# Patient Record
Sex: Male | Born: 1935 | Race: White | Hispanic: No | Marital: Married | Smoking: Current every day smoker
Health system: Southern US, Community
[De-identification: ages and names within clinical notes are randomized; demographics above are authoritative.]

## PROBLEM LIST (undated history)

## (undated) DIAGNOSIS — E785 Hyperlipidemia, unspecified: Secondary | ICD-10-CM

## (undated) DIAGNOSIS — I639 Cerebral infarction, unspecified: Secondary | ICD-10-CM

## (undated) DIAGNOSIS — I219 Acute myocardial infarction, unspecified: Secondary | ICD-10-CM

## (undated) DIAGNOSIS — I10 Essential (primary) hypertension: Secondary | ICD-10-CM

## (undated) DIAGNOSIS — N183 Chronic kidney disease, stage 3 unspecified: Secondary | ICD-10-CM

## (undated) DIAGNOSIS — I509 Heart failure, unspecified: Secondary | ICD-10-CM

## (undated) HISTORY — PX: CORONARY ANGIOPLASTY WITH STENT PLACEMENT: SHX49

## (undated) HISTORY — PX: CAROTID ENDARTERECTOMY: SUR193

---

## 2003-10-30 ENCOUNTER — Other Ambulatory Visit: Payer: Self-pay

## 2004-10-19 ENCOUNTER — Ambulatory Visit: Payer: Self-pay | Admitting: Internal Medicine

## 2011-09-07 ENCOUNTER — Other Ambulatory Visit: Payer: Self-pay | Admitting: Nephrology

## 2011-11-13 ENCOUNTER — Emergency Department: Payer: Self-pay | Admitting: Internal Medicine

## 2012-07-11 ENCOUNTER — Inpatient Hospital Stay: Payer: Self-pay | Admitting: Family Medicine

## 2012-07-11 LAB — COMPREHENSIVE METABOLIC PANEL
Albumin: 3.6 g/dL (ref 3.4–5.0)
Alkaline Phosphatase: 89 U/L (ref 50–136)
Anion Gap: 10 (ref 7–16)
BUN: 32 mg/dL — ABNORMAL HIGH (ref 7–18)
Creatinine: 2.01 mg/dL — ABNORMAL HIGH (ref 0.60–1.30)
EGFR (Non-African Amer.): 31 — ABNORMAL LOW
Glucose: 151 mg/dL — ABNORMAL HIGH (ref 65–99)
Osmolality: 285 (ref 275–301)
Potassium: 4.7 mmol/L (ref 3.5–5.1)
SGOT(AST): 15 U/L (ref 15–37)
Sodium: 138 mmol/L (ref 136–145)
Total Protein: 7.8 g/dL (ref 6.4–8.2)

## 2012-07-11 LAB — TROPONIN I: Troponin-I: <0.02 ng/mL

## 2012-07-11 LAB — CBC
HCT: 41.6 % (ref 40.0–52.0)
HGB: 13.5 g/dL (ref 13.0–18.0)
MCH: 30.9 pg (ref 26.0–34.0)
MCHC: 32.6 g/dL (ref 32.0–36.0)
RDW: 14.4 % (ref 11.5–14.5)

## 2012-07-11 LAB — URINALYSIS, COMPLETE
Bacteria: NONE SEEN
Bilirubin,UR: NEGATIVE
Glucose,UR: NEGATIVE mg/dL (ref 0–75)
Hyaline Cast: 3
Leukocyte Esterase: NEGATIVE
Nitrite: NEGATIVE
Specific Gravity: 1.014 (ref 1.003–1.030)
Squamous Epithelial: <1
WBC UR: 1 /HPF (ref 0–5)

## 2012-07-11 LAB — CK TOTAL AND CKMB (NOT AT ARMC): CK-MB: 0.9 ng/mL (ref 0.5–3.6)

## 2012-07-11 LAB — TSH: Thyroid Stimulating Horm: 1.47 u[IU]/mL

## 2012-07-12 LAB — BASIC METABOLIC PANEL
BUN: 33 mg/dL — ABNORMAL HIGH (ref 7–18)
Calcium, Total: 8.1 mg/dL — ABNORMAL LOW (ref 8.5–10.1)
Chloride: 108 mmol/L — ABNORMAL HIGH (ref 98–107)
Co2: 23 mmol/L (ref 21–32)
EGFR (Non-African Amer.): 30 — ABNORMAL LOW
Glucose: 138 mg/dL — ABNORMAL HIGH (ref 65–99)
Osmolality: 287 (ref 275–301)
Potassium: 4 mmol/L (ref 3.5–5.1)
Sodium: 139 mmol/L (ref 136–145)

## 2012-07-12 LAB — LIPID PANEL
Cholesterol: 98 mg/dL (ref 0–200)
Ldl Cholesterol, Calc: 39 mg/dL (ref 0–100)

## 2012-07-12 LAB — CBC WITH DIFFERENTIAL/PLATELET
Basophil #: 0.1 10*3/uL (ref 0.0–0.1)
Eosinophil #: 0.2 10*3/uL (ref 0.0–0.7)
HGB: 12.7 g/dL — ABNORMAL LOW (ref 13.0–18.0)
Lymphocyte #: 2.4 10*3/uL (ref 1.0–3.6)
Lymphocyte %: 18.1 %
MCH: 31.2 pg (ref 26.0–34.0)
MCHC: 33.1 g/dL (ref 32.0–36.0)
Monocyte #: 0.6 x10 3/mm (ref 0.2–1.0)
Neutrophil #: 9.9 10*3/uL — ABNORMAL HIGH (ref 1.4–6.5)
RBC: 4.05 10*6/uL — ABNORMAL LOW (ref 4.40–5.90)
RDW: 14.4 % (ref 11.5–14.5)

## 2012-07-12 LAB — CK TOTAL AND CKMB (NOT AT ARMC)
CK, Total: 76 U/L (ref 35–232)
CK, Total: 203 U/L (ref 35–232)

## 2012-07-12 IMAGING — US US CAROTID DUPLEX BILAT
1 series · 14 of 24 positions shown · non-contrast
Comparison: none

REASON FOR EXAM: syncope
COMMENTS:

PROCEDURE:     US  - US CAROTID DOPPLER BILATERAL  - [DATE]  [DATE]
RESULT:
TECHNIQUE: Grayscale, Duplex Doppler, color flow, and SPECTRAL waveform
imaging was performed of the right and left carotid systems.

[Series 1: us carotid duplex bilat · 14 of 54 slices shown]
[im 1/54]
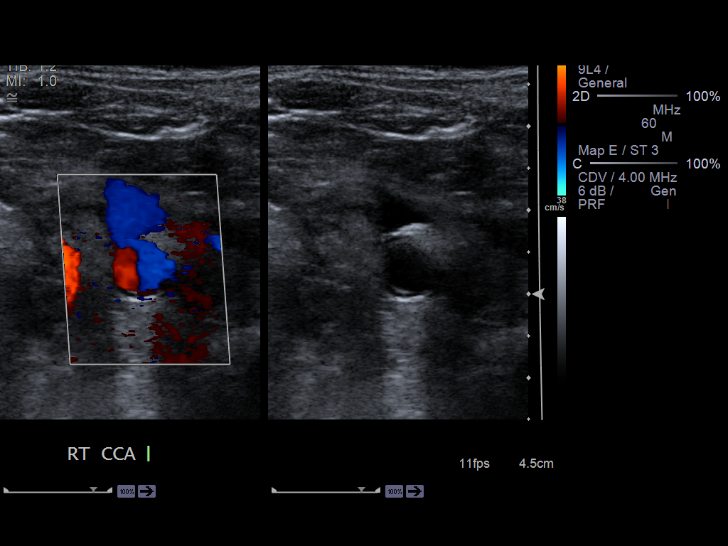
[im 5/54]
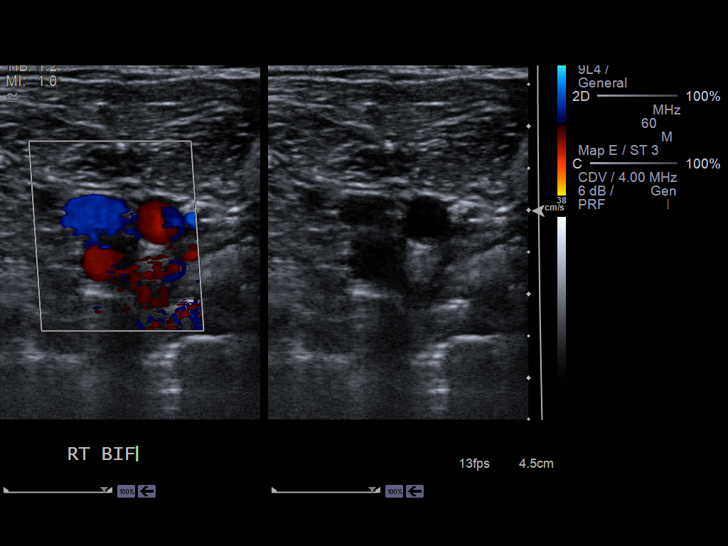
[im 10/54]
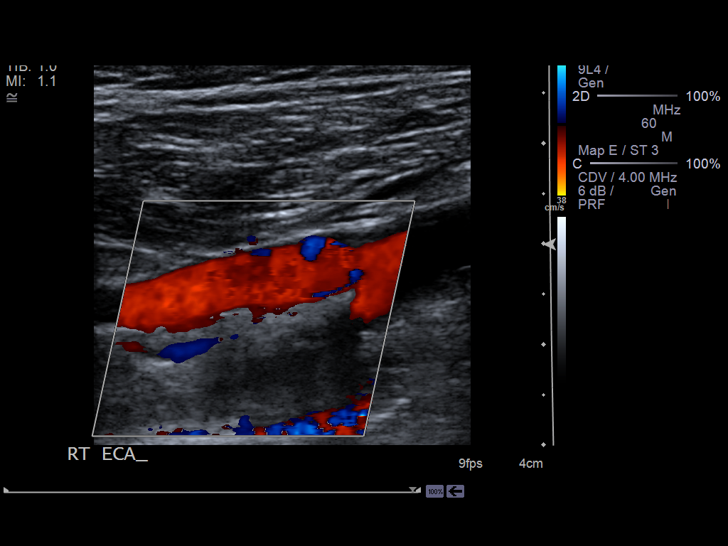
[im 14/54]
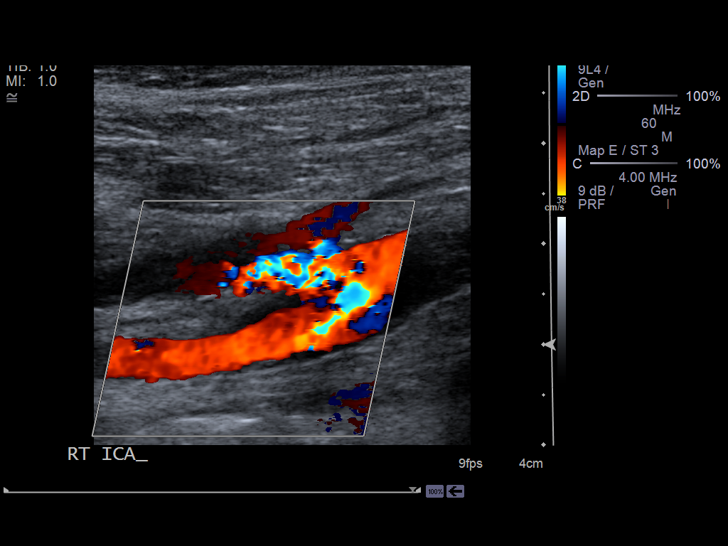
[im 17/54]
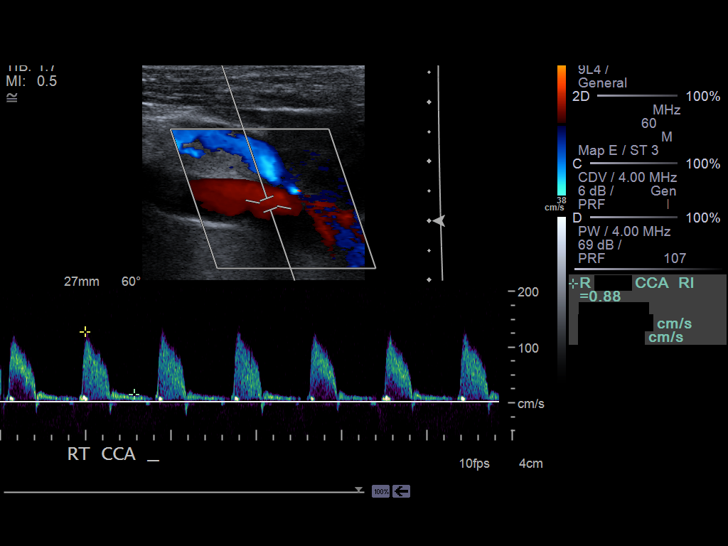
[im 21/54]
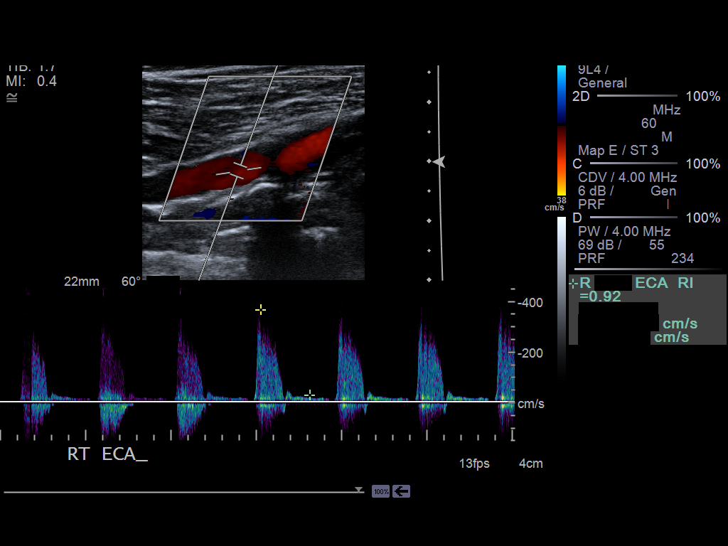
[im 26/54]
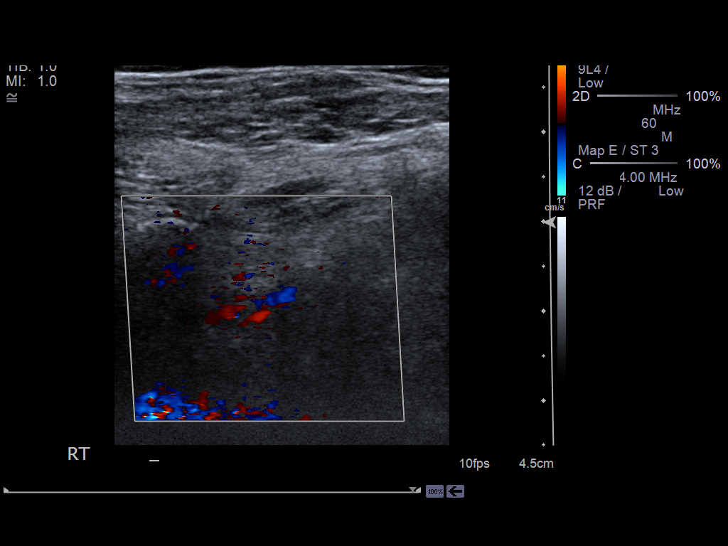
[im 28/54]
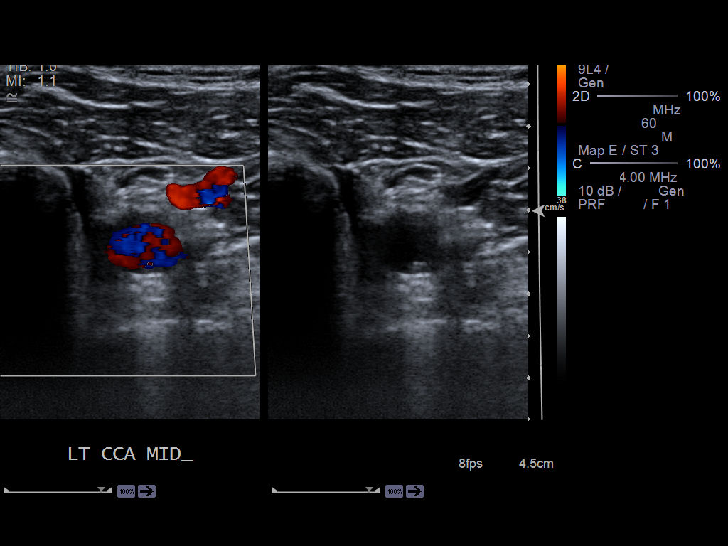
[im 33/54]
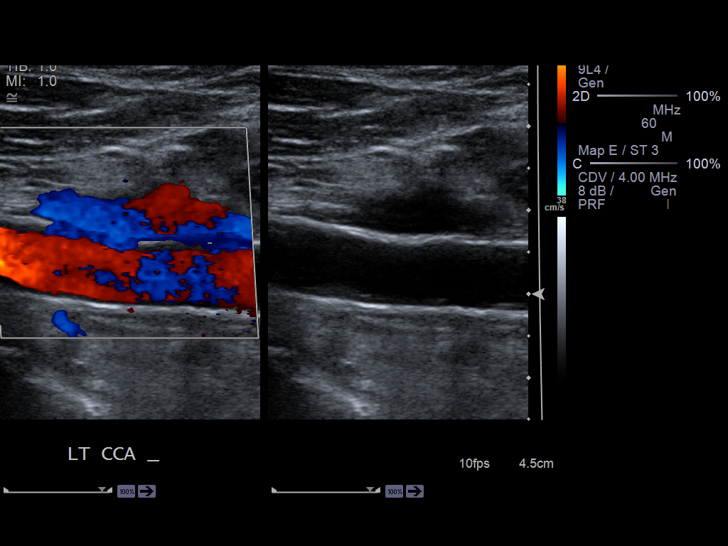
[im 37/54]
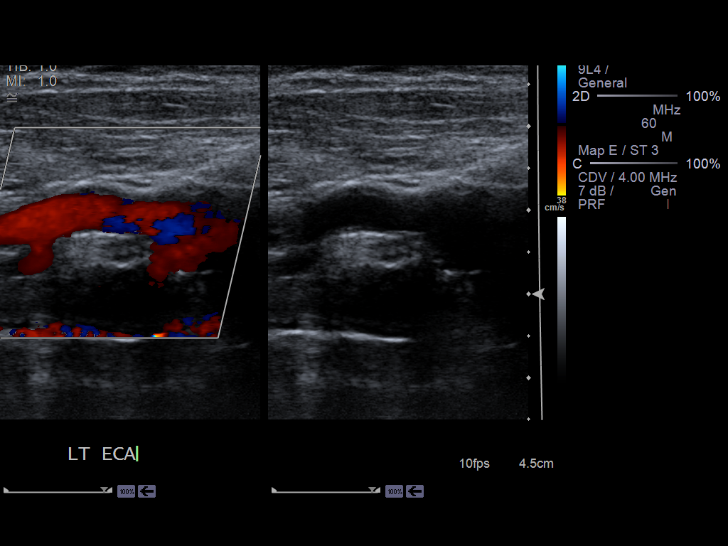
[im 42/54]
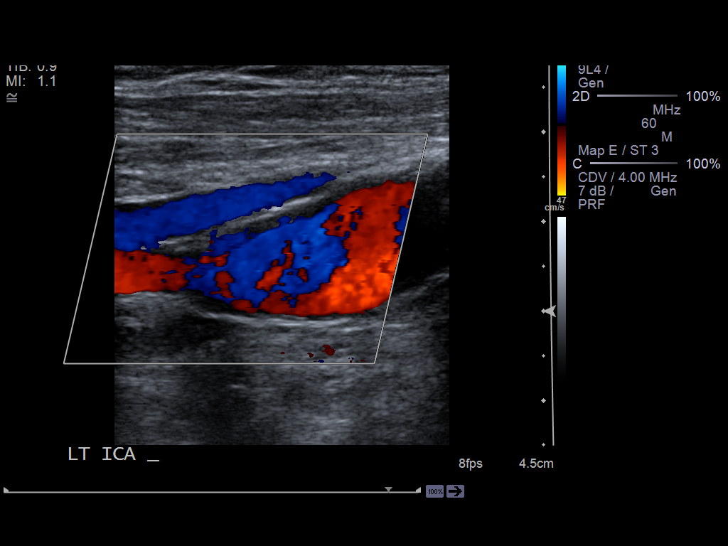
[im 44/54]
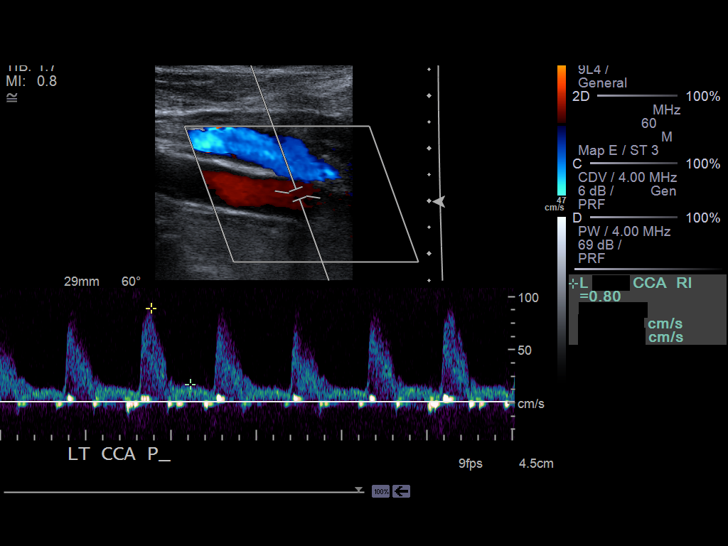
[im 49/54]
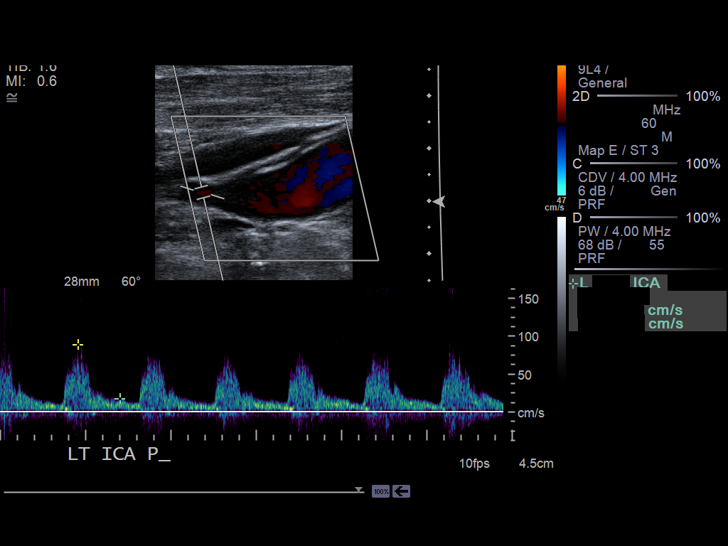
[im 54/54]
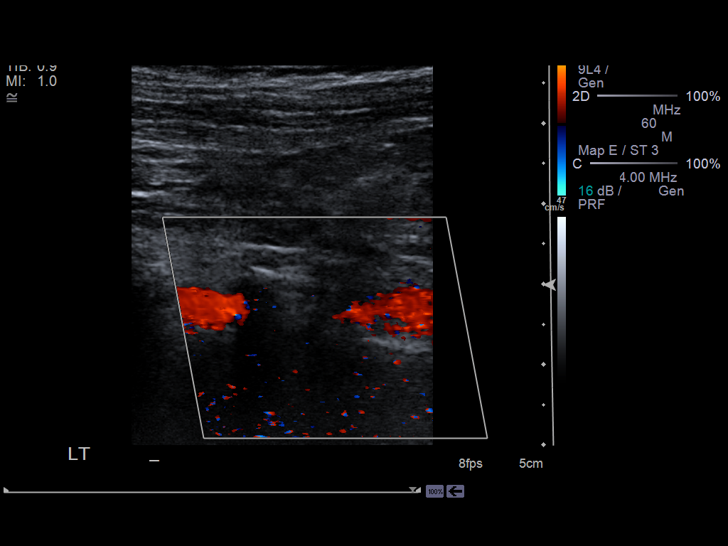

[14 of 24 positions shown; findings below may reference images not displayed]

FINDINGS: Evaluation of the right carotid system demonstrates primarily soft
plaque within the common carotid, carotid bulb and internal carotid artery
demonstrating less than 50% stenosis. Similar findings are identified on the
left. Appropriate color filling is identified within the right and left
carotid systems as well as appropriate SPECTRAL waveform imaging. Antegrade
flow is identified within the right and left vertebral arteries.

ICA/CCA ratios:

Right:
Left:
IMPRESSION: 1. No sonographic evidence of hemodynamically significant stenosis within
the right or left carotid systems.

## 2012-07-13 LAB — BASIC METABOLIC PANEL
Anion Gap: 9 (ref 7–16)
BUN: 24 mg/dL — ABNORMAL HIGH (ref 7–18)
Chloride: 108 mmol/L — ABNORMAL HIGH (ref 98–107)
Co2: 23 mmol/L (ref 21–32)
EGFR (African American): 39 — ABNORMAL LOW
EGFR (Non-African Amer.): 34 — ABNORMAL LOW
Osmolality: 283 (ref 275–301)

## 2012-10-26 ENCOUNTER — Ambulatory Visit: Payer: Self-pay | Admitting: Vascular Surgery

## 2012-10-26 LAB — BASIC METABOLIC PANEL
Anion Gap: 9 (ref 7–16)
BUN: 28 mg/dL — ABNORMAL HIGH (ref 7–18)
Calcium, Total: 8.8 mg/dL (ref 8.5–10.1)
Chloride: 110 mmol/L — ABNORMAL HIGH (ref 98–107)
Co2: 20 mmol/L — ABNORMAL LOW (ref 21–32)
Creatinine: 2.03 mg/dL — ABNORMAL HIGH (ref 0.60–1.30)
EGFR (African American): 36 — ABNORMAL LOW
EGFR (Non-African Amer.): 31 — ABNORMAL LOW
Glucose: 109 mg/dL — ABNORMAL HIGH (ref 65–99)
Osmolality: 284 (ref 275–301)
Potassium: 5 mmol/L (ref 3.5–5.1)
Sodium: 139 mmol/L (ref 136–145)

## 2014-08-12 LAB — COMPREHENSIVE METABOLIC PANEL
ALT: 20 U/L
AST: 16 U/L (ref 15–37)
Albumin: 3.1 g/dL — ABNORMAL LOW (ref 3.4–5.0)
Alkaline Phosphatase: 83 U/L
Anion Gap: 10 (ref 7–16)
BUN: 19 mg/dL — AB (ref 7–18)
Bilirubin,Total: 0.2 mg/dL (ref 0.2–1.0)
Calcium, Total: 8.3 mg/dL — ABNORMAL LOW (ref 8.5–10.1)
Chloride: 105 mmol/L (ref 98–107)
Co2: 23 mmol/L (ref 21–32)
Creatinine: 1.85 mg/dL — ABNORMAL HIGH (ref 0.60–1.30)
EGFR (African American): 40 — ABNORMAL LOW
GFR CALC NON AF AMER: 34 — AB
GLUCOSE: 143 mg/dL — AB (ref 65–99)
Osmolality: 280 (ref 275–301)
POTASSIUM: 3.9 mmol/L (ref 3.5–5.1)
SODIUM: 138 mmol/L (ref 136–145)
Total Protein: 7.1 g/dL (ref 6.4–8.2)

## 2014-08-12 LAB — URINALYSIS, COMPLETE
Bilirubin,UR: NEGATIVE
Glucose,UR: NEGATIVE mg/dL (ref 0–75)
Ketone: NEGATIVE
LEUKOCYTE ESTERASE: NEGATIVE
NITRITE: NEGATIVE
PROTEIN: NEGATIVE
Ph: 5 (ref 4.5–8.0)
SPECIFIC GRAVITY: 1.013 (ref 1.003–1.030)
Squamous Epithelial: NONE SEEN
WBC UR: <1 /HPF (ref 0–5)

## 2014-08-12 LAB — CBC
HCT: 38.8 % — ABNORMAL LOW (ref 40.0–52.0)
HGB: 12.5 g/dL — ABNORMAL LOW (ref 13.0–18.0)
MCH: 31.1 pg (ref 26.0–34.0)
MCHC: 32.2 g/dL (ref 32.0–36.0)
MCV: 97 fL (ref 80–100)
Platelet: 258 10*3/uL (ref 150–440)
RBC: 4.02 10*6/uL — ABNORMAL LOW (ref 4.40–5.90)
RDW: 13.8 % (ref 11.5–14.5)
WBC: 12.6 10*3/uL — ABNORMAL HIGH (ref 3.8–10.6)

## 2014-08-12 LAB — TROPONIN I: Troponin-I: <0.02 ng/mL

## 2014-08-13 ENCOUNTER — Inpatient Hospital Stay: Payer: Self-pay | Admitting: Family Medicine

## 2015-03-25 NOTE — Discharge Summary (Signed)
PATIENT NAME:  Lonnie [name], Lonnie [name] MR#:  [id number] DATE OF BIRTH:  1934-03-29  DATE OF ADMISSION:  07/11/2012 DATE OF DISCHARGE:  07/13/2012  DISCHARGE DIAGNOSES:  1. Transient ischemic attack.  2. History of cerebrovascular accident.  3. Hypertension.  4. History of coronary artery disease status post myocardial infarction.  5. Chronic renal failure.  6. Hyperlipidemia.  7. New seizure.   DISCHARGE MEDICATIONS:  1. Metoprolol 50 mg p.o. b.i.[name].  2. Lisinopril 20 mg p.o. daily.  3. Lovastatin 40 mg p.o. daily.  4. Folic acid 1 mg p.o. daily.  5. Hydrochlorothiazide 25 mg p.o. daily.  6. Plavix 75 mg p.o. daily.   CONSULT: Neurology per Dr. Kemper Durielarke    PROCEDURES: None.   PERTINENT LABS: The patient had a CT of the head that showed no acute changes.   MRI of the brain showed chronic ischemic changes. No acute changes. Carotids were negative for stenosis.   Cardiac enzymes were negative. TSH 1.47. LDL 39. Total cholesterol 98. Triglycerides 142. HDL 31. Sodium 140. Potassium 4.4. Creatinine 1.88.   BRIEF HOSPITAL COURSE:  1. Transient ischemic attack with history of cerebrovascular accident. The patient initially came in with complaints of focal weakness with a history of CVA on aspirin. His symptoms have improved and resolved prior to discharge. He was seen by Neurology, Dr. Kemper Durielarke, who did not recommend any changes to his regimen. There was some concern of whether or not he had a seizure during this episode. Per Dr. Kemper Durielarke, will hold off on antiepileptic meds for now. The patient was evaluated by PT, OT, and speech therapy and all cleared him to go home without any further therapy.  2. Disposition. He is in stable condition being discharged to home with instructions to follow his blood pressure closely and follow-up with Dr. Burnadette PopLinthavong within two weeks.   ____________________________ Lonnie IvanKanhka Jessamy Torosyan, MD kl:drc [name]: 07/13/2012 08:01:05 ET T: 07/13/2012 10:31:19  ET JOB#: [id number]  cc: Lonnie IvanKanhka Ashby Moskal, MD, <Dictator> Lonnie IvanKANHKA Yann Biehn MD ELECTRONICALLY SIGNED 08/03/2012 11:00

## 2015-03-25 NOTE — H&P (Signed)
PATIENT NAME:  [name], Anil D MR#:  [id number] DATE OF BIRTH:  1934/11/16  DATE OF ADMISSION:  07/11/2012  PRIMARY CARE PHYSICIAN: Dr. Marisue IvanKanhka Linthavong   CHIEF COMPLAINT: Loss of consciousness.   HISTORY OF PRESENT ILLNESS: 81 year old male with history of prior cerebrovascular accident with residual left side weakness per report, history of coronary artery disease with prior myocardial infarction, history of peripheral vascular disease with prior stenting in the right leg. He reports that over the last several days he has been a little less attentive, as noted by his wife, the patient had noted much of this on his own. This afternoon he had an episode where he became abruptly diaphoretic and less responsive. He did not appear to be indicating any chest pain, shortness of breath, or any other focal problems. Shortly after this he had an episode where he had complete loss of consciousness, eyes rolling back in his head. He was seated and he shifted to the right when this happened. His wife noted that his left hand and arm seem to flex up against his chest wall. He was unresponsive for several minutes before he awoke. At that time he appeared to be confused and dysarthric. She was not sure whether he was moving his left arm. She reports that she could not feel a pulse and that she was trained to look for a pulse after he had his previous myocardial infarction. After about five minutes she did feel a pulse and felt that this was very slow. Ambulance was contacted, and on arrival recommend transfer to the local hospital, the patient refused. Family members then [facility], stopping to get him some water on the way. During the trip he appeared to become more lucid, though did complain of some left leg numbness. His speech is improved, though appears to be still thick to their ear. On arrival here CT of the head without contrast reveals no acute changes, but with atrophy and small  vessel disease. Initial cardiac enzymes are negative. He is noted to have elevated white count, with BUN 32 and creatinine of 2, with creatinine close to his baseline. On evaluation he was found to have left side weakness. He is admitted now with an episode of syncope, question of seizure, but appears to be more likely myoclonic activity, possible transient ischemic attack versus cerebrovascular accident.   PAST MEDICAL HISTORY:  1. History of cerebrovascular accident with left side weakness per report.  2. Hypertension.  3. Coronary artery disease with prior myocardial infarction, status post Cypher stent.  4. Peripheral vascular disease with history of right iliac stenosis, status post stenting of the right iliac and femoral artery many years ago.  5. Chronic kidney disease, stage III with baseline creatinine of approximately 2.  6. Hyperlipidemia.  7. History of bilateral carotid endarterectomies.   ALLERGIES: Simvastatin causes questionable reaction.   MEDICATIONS:  1. Hydrochlorothiazide 25 mg p.o. daily. 2. Lopressor 50 mg p.o. b.i.d.  3. Folate 1 mg p.o. daily.  4. Enteric-coated aspirin 81 mg p.o. daily.  5. Lisinopril 20 mg p.o. daily.  6. Lovastatin 40 mg p.o. at bedtime.   SOCIAL HISTORY: Smokes approximately half pack per day and continues to smoke. No alcohol use.   FAMILY HISTORY: Father with pneumonia, mother with heart disease. Sister with heart disease.   REVIEW OF SYSTEMS: Please see history of present illness. No recent fevers, chills, headache, neck stiffness. No current chest pain or shortness of breath. Generalized weakness described  by patient. No bowel or bladder changes. No edema. Remainder of complete review of systems is negative.   PHYSICAL EXAMINATION:  VITAL SIGNS: Temperature 97.2, pulse 62, blood pressure 161/70, weight 68.4 kg.   GENERAL: Elderly male, appears in no distress.   EYES: Pupils round and reactive to light. Lids and conjunctiva  unremarkable.   ENT: Oropharynx is slightly dry without lesions. May have left facial droop.   NECK: Postoperative changes. Trachea in the midline. No thyromegaly.   CARDIOVASCULAR: Distant heart sounds, regular, without gallops, rubs. Carotid and radial pulses are 2+ with distal pulses 1+.   LUNGS: Decreased air flow without wheeze or retractions.   ABDOMEN: Soft, nontender, nondistended. Positive bowel sounds. No guard or rebound.   SKIN: No significant rashes or nodules.   LYMPH NODES: No cervical or supraclavicular nodes.   MUSCULOSKELETAL: No clubbing or cyanosis. No significant edema.   NEUROLOGIC: Dysarthria as noted above with mild left facial droop. Answers questions appropriately. Mild left pronator drift with decreased grip in the left hand compared to right. Finger-to-nose intact. Minimal left leg weakness in comparison to right. Subjective decreased light touch in the left leg.   LABORATORY, DIAGNOSTIC, AND RADIOLOGICAL DATA: CT of the head without contrast reveals involutional changes, and chronic small vessel disease. Urinalysis is unremarkable. White count 15 with hematocrit 42 and platelets 260. Initial enzymes are negative. Glucose 151 with creatinine 2.01. Liver enzymes unremarkable. EKG with sinus rhythm without acute changes. Right bundle branch block appears present.   IMPRESSION AND PLAN:  1. Syncope/cerebrovascular accident versus transient ischemic attack. Sequence of events is unclear, but vagal episode appears likely with this diaphoresis, and may have had drop in blood pressure and heart rate with this, which certainly could have resulted in a low flow state, resulting in transient ischemic attack versus cerebrovascular accident, though unable to determine whether he might actually have had a neurovascular event initially. Will anticipate performing MRI of the brain in the morning without contrast secondary to his chronic kidney disease, provided the type of stent  he has in his leg is amenable to performing this. Family members report they have documentation on this, which was performed at [facility], will bring this in. He does have left side weakness, but without a clear idea of what his baseline is. It is difficult to determine how much this is different. Possibility of underlying seizure must be entertained, but fairly rapid recovery. Would ask neurology to see the patient for their opinion, but would hold on any antiseizure medications at this point unless there is more definitive evidence.  2. Coronary artery disease. Continue on Lopressor, reducing dose slightly secondary to some bradycardia. Cycle his enzymes. Change aspirin to Plavix, given possible neurovascular event. Side effects of this were discussed. Will continue on lisinopril, continue on lovastatin for now. Check lipids in the morning.  3. Peripheral vascular disease. He has got baseline limitation on his activity level likely due to some claudication and will need to keep this in mind once we got him up and ambulating. Would anticipate physical therapy seeing him in the morning. We will ask occupational therapy and speech therapy see him as well with the above issues.   Above plan of action was discussed with the patient and family members and they are in agreement.  ____________________________ Lynnea Ferrier, MD bjk:cms D: 07/11/2012 18:29:59 ET T: 07/12/2012 06:09:48 ET JOB#: [id number]  cc: Lynnea Ferrier, MD, <Dictator> Marisue Ivan, MD Daniel Nones MD ELECTRONICALLY SIGNED  07/13/2012 7:33 

## 2015-03-25 NOTE — Consult Note (Signed)
PATIENT NAME:  [name], [name] MR#:  [id number] DATE OF BIRTH:  12-31-1933  DATE OF CONSULTATION:  07/12/2012  REFERRING PHYSICIAN:  Dr. Daniel NonesBert Klein  CONSULTING PHYSICIAN:  Rose PhiPeter R. Kemper Durielarke, MD  HISTORY: 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 <<MISSING TEXT>> (dictation cut off here)  ____________________________ Rose PhiPeter R. Kemper Durielarke, MD prc:cms D: 07/13/2012 07:50:01 ET T: 07/13/2012 09:02:58 ET JOB#: [id number]  cc: Rose PhiPeter R. Kemper Durielarke, MD, <Dictator>

## 2015-03-25 NOTE — Op Note (Signed)
PATIENT NAME:  Lonnie [name], Lonnie [name] MR#:  [id number] DATE OF BIRTH:  05/30/1934  DATE OF PROCEDURE:  10/26/2012  PREOPERATIVE DIAGNOSES:  1. PAD with rest pain left lower extremity and claudication right lower extremity.  2. Tobacco dependence.  3. History of stroke.  4. Hypertension.   POSTOPERATIVE DIAGNOSES: 1. PAD with rest pain left lower extremity and claudication right lower extremity.  2. Tobacco dependence.  3. History of stroke.  4. Hypertension.   PROCEDURES:  1. Ultrasound guidance for vascular access, right femoral artery.  2. Catheter placement into aorta from right femoral approach.  3. Aortogram and iliofemoral arteriogram.  4. Percutaneous transluminal angioplasty of right iliac artery stenosis with 6 mm diameter angioplasty balloon.  5. StarClose closure device, right femoral artery.   SURGEON: Annice Needy, MD   ANESTHESIA: Local with moderate conscious sedation.   ESTIMATED BLOOD LOSS: Minimal.   FLUOROSCOPY TIME: 6.5 minutes.   CONTRAST USED: 35 mL.   INDICATION FOR PROCEDURE: This is a 81 year old white male with severe peripheral vascular disease. He has heavy tobacco dependence and continues to smoke. He has rest pain in the left leg. He has short distance claudication of the right leg. He has undergone previous interventions per report. He is brought in for angiography for further evaluation and potential treatment. Risks and benefits were discussed. Informed consent was obtained.   DESCRIPTION OF PROCEDURE: The patient is brought to the Vascular Interventional Radiology Suite. Groins were shaved and prepped and a sterile surgical field was created. Due to the very poor pulse in the right femoral artery, this was accessed under direct ultrasound guidance without difficulty with a Seldinger needle. Weakly pulsatile flow was seen and we placed a 5 French sheath. Imaging showed an 80 to 85% stenosis in the right iliac artery within the distal portion of a  previously placed stent that went up into the common iliac artery and the stenosis traversed down to the external iliac artery. With a Terumo advantage wire I was able to cross the lesion. A pigtail catheter was placed in the aorta and AP aortogram was performed. I then pulled down and performed a pelvic oblique. This demonstrated the above-mentioned right iliac artery stenosis. The aorta itself was patent. The renal arteries had some degree of disease, although visualization was not ideal. The left iliac artery occluded about a centimeter beyond its origin. The reconstitution was extremely poor and only a wisp of blood flow was seen in the left profunda femoris artery. I went on down the leg to try to evaluate the left SFA and distal profound and again the visualization was quite poor. I replaced the Terumo advantage wire, balloon angioplastied the right iliac stenosis with a 6 mm diameter angioplasty balloon with a good angiographic completion result and improved flow. I attempted to gain access to the left iliac occlusion with a rim catheter and a VS-1 catheter but it was clear this was going to be unsuccessful and I do not think there would be any endovascular options anyway given the poor nature of the targets distally. At this point, we elected to terminate the procedure. The sheath was removed and a StarClose closure device was deployed in the usual fashion with excellent hemostatic result. The patient tolerated the procedure well and was taken to the recovery room in stable condition.   ____________________________ Annice Needy, MD jsd:drc D: 10/26/2012 09:19:17 ET T: 10/26/2012 10:14:53 ET JOB#: [id number]  cc: Annice Needy, MD, <Dictator> Marisue Ivan, MD Barbara Cower  Driscilla GrammesS Jayline Kilburg MD ELECTRONICALLY SIGNED 10/30/2012 11:46

## 2015-03-25 NOTE — Consult Note (Signed)
PATIENT NAME:  Lonnie [name], Lonnie [name] MR#:  [id number] DATE OF BIRTH:  06/07/1934  DATE OF CONSULTATION:  07/12/2012  REFERRING PHYSICIAN:  Daniel Nones, MD CONSULTING PHYSICIAN:  Rose Phi. 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 Marisue Ivan with history of chronic tobacco use, hypertension, hyperlipidemia, stage III chronic renal insufficiency, coronary artery disease status post myocardial infarction and coronary artery stent procedure, peripheral vascular occlusive disease status post right iliac and femoral artery stenting, and history of atherosclerotic cerebrovascular disease status post in the mid 1990s bilateral carotid endarterectomy surgery, each complicated by a stroke. He was admitted 07/11/2012 with history of loss of consciousness. He is referred for evaluation of syncope versus seizure. History comes from his hospital chart and from the patient and his wife.   The patient was brought to the Emergency Room at approximately 3:45 p.m. on 07/11/2012 by his wife secondary to concern regarding problems beginning at approximately 1:30 to 1:45 p.m. while they were in [place], [place] visiting the patient's sister. He had sat at the dining table for lunch and had had two or three bites of soup when "all at once", he reports he felt ill with head swimming, sweating, and nausea. He got up from the table and walked to the room to sit in a chair and asked for a bucket, feeling about to vomit. His wife reports that as he sat there she noted that he was sweating and clammy and cold. He appeared somewhat lethargic and had two bouts of emesis, and then became unresponsive He was noted to have urinary incontinence. She reports that he sat back in the chair with eyes closed appearing somewhat limp with the exception that the left upper extremity was flexed and positioned against his anterior chest. Unconsciousness lasted 5 or 10 minutes. He  then appeared to be slowly coming to consciousness and had mumbled speech. Over several minutes he gradually became more responsive and awake and was able to answer questions. EMTs were summoned and arrived 15 to 20 minutes after he had started to be unconscious. His wife reports that they found his blood sugar to be fine and his blood pressure was 102 systolic. Either diastolic pressure or heart rate was 85, per his wife. She reports while he was unconscious, she felt for his pulse, but she could barely feel the pulse,  but this became more strong as he started to regain consciousness.   The patient deferred transport to the hospital by EMTs, then agreed to being driven home and to the hospital at [facility] by his wife. She reports that by the time he arrived at the emergency room in [place], he was pretty much his usual self with the exception that he appeared a little bit lethargic.   The patient recalls the onset of feeling unwell at the dining table and recalls walking to the living room and having episodes of emesis. He reports that he was next aware that he was being addressed by family and then the EMT sitting in a chair.   His wife reports that he had a much milder spell without full loss of consciousness in December 2012; he was seen in the ER at [facility] and had a CT scan of the head that showed no acute changes. She reports that once in the past he had loss of consciousness and had urinary incontinence; this occurred when he had blood drawn in the early 1990s as part of evaluation  of carotid artery disease and CEA surgeries.   Studies in hospital include brain CT scan and MRI scan which did not show acute findings. His scans showed significant encephalomalacia in the right hemisphere in the territory of the middle cerebral artery.   PHYSICAL EXAMINATION: The patient is a well developed and mild to moderately overweight white gentleman who was pleasant and cooperative in no apparent distress, examined  lying semisupine. He was normocephalic without evidence of trauma. His neck was supple. He was alert and oriented with clear speech and normal expression and was lucid and a good historian with normal affect. On cranial nerve examination, there was very mild decrease of nasolabial fold on the left with face appearance otherwise symmetric. Eye movements were normal. Visual field testing showed some relative left side impairment of visual fields for each eye. Motor examination showed increased tone in the left arm with normal tone in the right arm and both legs. Strength was good proximally and distally in the four extremities. Reflexes were increased in the left upper extremity at 3+ and were rated 1+ in the right arm and both legs. He denied asymmetry to extremity sensory testing. Coordination examination was notable for slight tremor with finger-to-nose testing on the left and some relative slowing of left hand tapping movements. His gait was not tested.   IMPRESSION:  1. Old right hemisphere middle cerebral artery territory infarction, consistent with problems described by the patient's wife following his first endarterectomy procedure, performed on the right in 1992.  2. History of having had a second and milder stroke at the time of subsequent left carotid endarterectomy in the mid 1990s; details from the patient's wife were sketchy.  3. Symptoms in early afternoon on 07/11/2012 of not entirely clear etiology with differential diagnosis including seizure episode beginning with feeling unwell with some swimmy-headedness and nausea, versus apparent seizure as judged by left arm posturing and period of confusion after the episode, precipitated by episode of decreased cerebral perfusion, possibly associated with cardiac dysrhythmia or possibly angina, however his cardiac enzymes have been normal.   He has imaging evidence of having had sizable right hemisphere infarction in the past including areas of cortex.  This would be a somewhat atypical history of beginning of seizures, possibly 20 years after stroke but this is a possibility. It is possible that he is not having spontaneous seizures but had seizure precipitated by episode of decreased cerebral perfusion.   RECOMMENDATIONS: I agree with his present work-up and treatment in the hospital including imaging and laboratory studies. I recommend at this point holding on initiation of anticonvulsant medication unless or until he has spells in the future suggestive of recurrence of seizure. I have recommended to the patient and his wife that he have monitoring device for home check of blood pressures and heart rates because of history of hypertension, so that they will be able to check his blood pressure and heart rate if he were to have spells of altered behavior.   I appreciate being asked to see this pleasant and interesting gentleman.  ____________________________ Rose PhiPeter R. Kemper Durielarke, MD prc:slb D: 07/13/2012 14:52:49 ET T: 07/13/2012 17:08:26 ET JOB#: [id number]  cc: Rose PhiPeter R. Kemper Durielarke, MD, <Dictator> Gaspar GarbePETER R Ronette Hank MD ELECTRONICALLY SIGNED 07/14/2012 12:14

## 2015-03-29 NOTE — H&P (Signed)
PATIENT NAME:  Lonnie [name], [name] MR#:  [id number] DATE OF BIRTH:  [birth year]  DATE OF ADMISSION:  08/13/2014  PRIMARY CARE PHYSICIAN: Dr. Burnadette PopLinthavong.  REFERRING PHYSICIAN: Dr. Gladstone Pihavid Schaevitz    CHIEF COMPLAINT: Dizziness.   HISTORY OF PRESENT ILLNESS: Lonnie [name] is a 81 year old male with a previous history of CVA, walks with the help of a cane, hypertension, coronary artery disease status post stent placement, peripheral vascular disease, who comes to the Emergency Department with a complaint of dizziness and some level of confusion. However, the patient denies any confusion. The patient states that he was walking today and felt dizziness. It lasted for a few hours. Concerning this, the patient is brought to the Emergency Department. Work-up in the Emergency Department with EKG. Cardiac enzymes were unremarkable. CT head without contrast: No acute intracranial abnormality. The patient is noted to have elevated white blood cell count of 12,000. The patient is found to have cough with mild productive sputum. Denies any shortness of breath, nausea, or vomiting.   PAST MEDICAL HISTORY: 1. History of CVA with left-sided weakness.  2. Hypertension.  3. Coronary artery disease with a prior MI status post a stent placement.  4. Peripheral vascular disease with a history of right iliac stenosis status post stenting of the right iliac and femoral arteries.   5. Chronic kidney disease.  6. Hyperlipidemia.  7. Bilateral carotid endarterectomies.    ALLERGIES: SIMVASTATIN.   HOME MEDICATIONS: 1. Tylenol 2 tablets every 6 hours as needed.  2. Plavix 75 mg once a day.  3. Metoprolol 1 tablet 2 times a day.  4. Lovastatin 40 mg once a day.  5. Lasix 20 mg once a day.  6. Folic acid 1 mg once a day.   SOCIAL HISTORY: Continues to smoke. The patient smokes about 1-1/2 packs a day. Denies drinking alcohol or using illicit drugs. Married; lives with his wife.   FAMILY HISTORY: Father died of pneumonia;  mother with heart disease. Sister with heart disease.   REVIEW OF SYSTEMS: CONSTITUTIONAL: Experiencing generalized weakness.  EYES: No change in vision.  ENT: No change in hearing.  RESPIRATORY: Has cough, shortness of breath.  CARDIOVASCULAR: No chest pain, palpations. GASTROINTESTINAL: No nausea, vomiting, abdominal pain.  GENITOURINARY: No dysuria or hematuria.  HEMATOLOGICAL: No easy bruising or bleeding.  SKIN: No rash or lesions.  MUSCULOSKELETAL: No joint pains and aches.  NEUROLOGIC: Had no weakness or numbness in any part of the body.   PHYSICAL EXAMINATION: GENERAL: This is a they have well-built, well-nourished, age-appropriate male lying down in the bed, not in distress.  VITAL SIGNS: Temperature 98.6, pulse 91, blood pressure 163/55, respiratory rate of 24, oxygen saturation is 94% on room air.  HEENT: Head normocephalic, atraumatic, there is no scleral icterus. Conjunctivae normal. Pupils equal and react to light. Mucous membranes moist.  NECK: Supple. No lymphadenopathy. No JVD. No carotid bruit.  CHEST: Has no focal tenderness. Coarse breath sounds, especially worse on the left side of the chest.  HEART: S1, S2 regular. No murmurs are heard.  ABDOMEN: Bowel sounds present. Soft, nontender, nondistended.  EXTREMITIES: No pedal edema. Pulses 2+.  NEUROLOGIC: The patient is alert, oriented to place, person, and time. Cranial nerves II through XII intact. Motor 5/5 in upper and lower extremities.   LABORATORY DATA: BMP: BUN 19, creatinine of 1.8. The rest of the values are within normal limits. CBC: WBC of 12.6, hemoglobin 12.5, platelet count of 258,000. Urinalysis negative for nitrites and leukocyte esterase. Chest  x-ray, 1 view portable: No acute cardiopulmonary disease. CT head without contrast: No acute findings.   ASSESSMENT AND PLAN: Lonnie [name] is a 81 year old male who comes in with some confusion and dizziness, concerned about chronic obstructive pulmonary disease  exacerbation.  1. Chronic obstructive pulmonary disease exacerbation. Will keep the patient on Rocephin and Zithromax, DuoNebs and Solu-Medrol.  2. Hypertension. On metoprolol and Lasix.  3. Dizziness. Will check orthostatic blood pressure.  4. Keep the patient on deep vein thrombosis prophylaxis with Lovenox.   TIME SPENT: 50 minutes.    ____________________________ Susa Griffins, MD pv:lm D: 08/13/2014 00:19:26 ET T: 08/13/2014 01:36:21 ET JOB#: [id number]  cc: Susa Griffins, MD, <Dictator> Marisue Ivan, MD Susa Griffins MD ELECTRONICALLY SIGNED 08/21/2014 21:31

## 2015-03-29 NOTE — Discharge Summary (Signed)
PATIENT NAME:  Lonnie [name], Lonnie [name] MR#:  [id number] DATE OF BIRTH:  10/18/1934  DATE OF ADMISSION:  08/13/2014 DATE OF DISCHARGE:  08/13/2014  DISCHARGE DIAGNOSES:  1.  Altered mental status consistent with dizziness and confusion that has now resolved.  2.  Chronic renal failure, creatinine on discharge was 1.85.   DISCHARGE MEDICATIONS: No changes to home regimen.  1.  Metoprolol tartrate 50 mg p.o. b.i.[name].  2.  Lovastatin 40 mg p.o. at bedtime.  3.  Folic acid 1 mg p.o. daily.  4.  Plavix 75 mg p.o. daily.  5.  Lasix 20 mg p.o. daily.  6.  Tylenol Extra Strength 500 mg 2 capsules every 8 hours as needed for pain.   CONSULTATIONS: None.   PROCEDURES: He had a carotid study that was negative for significant stenosis. Also, had a CT of the head that was negative for acute process.   BRIEF HOSPITAL COURSE: Altered mental status: The patient initially came in with confusion and dizziness, was evaluated in the Emergency Room and had a negative CT of the head. Cardiac enzymes were negative. Electrolytes and laboratories were all within normal limits. Chest x-ray was also negative. Unclear etiology at this time. He did get evaluated to make sure this was not a vascular event given his history of a CVA in the past. Continued on his current medications. Carotid studies were negative. Did not repeat an echocardiogram as he had no signs of fluid overload and no signs of a stroke at this time. We did ambulate the patient and he remained asymptomatic. Plan is to discharge home with his home medications. There was this question of whether or not he had a COPD exacerbation because of some acute dyspnea. After further evaluation in the morning, he had no cough, no sputum, no dyspnea, therefore, the antibiotics and steroids were discontinued.   FOLLOWUP: With Dr. Burnadette PopLinthavong in 10 days in the office.    ____________________________ Marisue IvanKanhka Alan Riles, MD kl:TT [name]: 08/13/2014 16:36:24 ET T: 08/13/2014 22:35:12  ET JOB#: [id number]  cc: Marisue IvanKanhka Lilyanah Celestin, MD, <Dictator> Marisue IvanKANHKA Jaynee Winters MD ELECTRONICALLY SIGNED 09/23/2014 8:29

## 2015-10-25 ENCOUNTER — Encounter: Payer: Self-pay | Admitting: Emergency Medicine

## 2015-10-25 ENCOUNTER — Observation Stay
Admission: EM | Admit: 2015-10-25 | Discharge: 2015-10-27 | Disposition: A | Discharge status: Home / Self Care | Payer: Medicare Other | Attending: Internal Medicine | Admitting: Internal Medicine

## 2015-10-25 ENCOUNTER — Emergency Department: Payer: Medicare Other

## 2015-10-25 DIAGNOSIS — Z955 Presence of coronary angioplasty implant and graft: Secondary | ICD-10-CM | POA: Insufficient documentation

## 2015-10-25 DIAGNOSIS — F17200 Nicotine dependence, unspecified, uncomplicated: Secondary | ICD-10-CM | POA: Insufficient documentation

## 2015-10-25 DIAGNOSIS — I259 Chronic ischemic heart disease, unspecified: Secondary | ICD-10-CM | POA: Insufficient documentation

## 2015-10-25 DIAGNOSIS — I4510 Unspecified right bundle-branch block: Secondary | ICD-10-CM | POA: Insufficient documentation

## 2015-10-25 DIAGNOSIS — R079 Chest pain, unspecified: Principal | ICD-10-CM | POA: Insufficient documentation

## 2015-10-25 DIAGNOSIS — I739 Peripheral vascular disease, unspecified: Secondary | ICD-10-CM | POA: Insufficient documentation

## 2015-10-25 DIAGNOSIS — I252 Old myocardial infarction: Secondary | ICD-10-CM | POA: Insufficient documentation

## 2015-10-25 DIAGNOSIS — J449 Chronic obstructive pulmonary disease, unspecified: Secondary | ICD-10-CM | POA: Insufficient documentation

## 2015-10-25 DIAGNOSIS — M545 Low back pain: Secondary | ICD-10-CM | POA: Insufficient documentation

## 2015-10-25 DIAGNOSIS — E785 Hyperlipidemia, unspecified: Secondary | ICD-10-CM | POA: Insufficient documentation

## 2015-10-25 DIAGNOSIS — N183 Chronic kidney disease, stage 3 (moderate): Secondary | ICD-10-CM | POA: Insufficient documentation

## 2015-10-25 DIAGNOSIS — Z8673 Personal history of transient ischemic attack (TIA), and cerebral infarction without residual deficits: Secondary | ICD-10-CM | POA: Insufficient documentation

## 2015-10-25 DIAGNOSIS — K219 Gastro-esophageal reflux disease without esophagitis: Secondary | ICD-10-CM | POA: Insufficient documentation

## 2015-10-25 DIAGNOSIS — I2510 Atherosclerotic heart disease of native coronary artery without angina pectoris: Secondary | ICD-10-CM | POA: Insufficient documentation

## 2015-10-25 DIAGNOSIS — I25119 Atherosclerotic heart disease of native coronary artery with unspecified angina pectoris: Secondary | ICD-10-CM | POA: Insufficient documentation

## 2015-10-25 DIAGNOSIS — I1310 Hypertensive heart and chronic kidney disease without heart failure, with stage 1 through stage 4 chronic kidney disease, or unspecified chronic kidney disease: Secondary | ICD-10-CM | POA: Insufficient documentation

## 2015-10-25 HISTORY — DX: Hyperlipidemia, unspecified: E78.5

## 2015-10-25 HISTORY — DX: Chronic kidney disease, stage 3 unspecified: N18.30

## 2015-10-25 HISTORY — DX: Cerebral infarction, unspecified: I63.9

## 2015-10-25 HISTORY — DX: Acute myocardial infarction, unspecified: I21.9

## 2015-10-25 HISTORY — DX: Essential (primary) hypertension: I10

## 2015-10-25 HISTORY — DX: Chronic kidney disease, stage 3 (moderate): N18.3

## 2015-10-25 LAB — BASIC METABOLIC PANEL
ANION GAP: 11 (ref 5–15)
BUN: 17 mg/dL (ref 6–20)
CO2: 22 mmol/L (ref 22–32)
Calcium: 9.2 mg/dL (ref 8.9–10.3)
Chloride: 105 mmol/L (ref 101–111)
Creatinine, Ser: 1.4 mg/dL — ABNORMAL HIGH (ref 0.61–1.24)
GFR calc Af Amer: 54 mL/min — ABNORMAL LOW (ref >60)
GFR calc non Af Amer: 46 mL/min — ABNORMAL LOW (ref >60)
Glucose, Bld: 114 mg/dL — ABNORMAL HIGH (ref 65–99)
POTASSIUM: 4.1 mmol/L (ref 3.5–5.1)
Sodium: 138 mmol/L (ref 135–145)

## 2015-10-25 LAB — CBC
HEMATOCRIT: 44.9 % (ref 40.0–52.0)
HEMOGLOBIN: 15.1 g/dL (ref 13.0–18.0)
MCH: 31.8 pg (ref 26.0–34.0)
MCHC: 33.6 g/dL (ref 32.0–36.0)
MCV: 94.9 fL (ref 80.0–100.0)
Platelets: 294 10*3/uL (ref 150–440)
RBC: 4.73 MIL/uL (ref 4.40–5.90)
RDW: 14.1 % (ref 11.5–14.5)
WBC: 10.4 10*3/uL (ref 3.8–10.6)

## 2015-10-25 LAB — TROPONIN I: Troponin I: <0.03 ng/mL (ref <0.031)

## 2015-10-25 MED ORDER — ALUM & MAG HYDROXIDE-SIMETH 200-200-20 MG/5ML PO SUSP
30.0000 mL | Freq: Four times a day (QID) | ORAL | Status: DC | PRN
Start: 2015-10-25 — End: 2015-10-27

## 2015-10-25 MED ORDER — ONDANSETRON HCL 4 MG/2ML IJ SOLN: 4.0000 mg | Freq: Four times a day (QID) | INTRAMUSCULAR | Status: DC | PRN

## 2015-10-25 MED ORDER — CLOPIDOGREL BISULFATE 75 MG PO TABS
75.0000 mg | ORAL_TABLET | Freq: Every day | ORAL | Status: DC
  Administered 2015-10-26: 75 mg via ORAL
  Filled 2015-10-25: qty 1

## 2015-10-25 MED ORDER — ACETAMINOPHEN 325 MG PO TABS
650.0000 mg | ORAL_TABLET | Freq: Four times a day (QID) | ORAL | Status: DC | PRN
  Administered 2015-10-25: 650 mg via ORAL
  Filled 2015-10-25: qty 2

## 2015-10-25 MED ORDER — METOPROLOL TARTRATE 50 MG PO TABS
50.0000 mg | ORAL_TABLET | Freq: Two times a day (BID) | ORAL | Status: DC
  Administered 2015-10-25 – 2015-10-26 (×2): 50 mg via ORAL
  Filled 2015-10-25 (×3): qty 1

## 2015-10-25 MED ORDER — FOLIC ACID 1 MG PO TABS
1.0000 mg | ORAL_TABLET | Freq: Every day | ORAL | Status: DC
  Administered 2015-10-26: 1 mg via ORAL
  Filled 2015-10-25: qty 1

## 2015-10-25 MED ORDER — BISACODYL 10 MG RE SUPP: 10.0000 mg | Freq: Every day | RECTAL | Status: DC | PRN

## 2015-10-25 MED ORDER — FUROSEMIDE 20 MG PO TABS
20.0000 mg | ORAL_TABLET | Freq: Every day | ORAL | Status: DC
  Administered 2015-10-25 – 2015-10-26 (×2): 20 mg via ORAL
  Filled 2015-10-25 (×2): qty 1

## 2015-10-25 MED ORDER — ACETAMINOPHEN 650 MG RE SUPP
650.0000 mg | Freq: Four times a day (QID) | RECTAL | Status: DC | PRN
Start: 2015-10-25 — End: 2015-10-27

## 2015-10-25 MED ORDER — SODIUM CHLORIDE 0.9 % IJ SOLN
3.0000 mL | Freq: Two times a day (BID) | INTRAMUSCULAR | Status: DC
  Administered 2015-10-25 – 2015-10-27 (×4): 3 mL via INTRAVENOUS

## 2015-10-25 MED ORDER — ASPIRIN 81 MG PO CHEW
324.0000 mg | CHEWABLE_TABLET | Freq: Once | ORAL | Status: AC
  Administered 2015-10-25: 324 mg via ORAL
  Filled 2015-10-25: qty 4

## 2015-10-25 MED ORDER — AMLODIPINE BESYLATE 5 MG PO TABS
5.0000 mg | ORAL_TABLET | Freq: Every day | ORAL | Status: DC
  Filled 2015-10-25: qty 1

## 2015-10-25 MED ORDER — PANTOPRAZOLE SODIUM 40 MG PO TBEC: 40.0000 mg | DELAYED_RELEASE_TABLET | Freq: Every day | ORAL | Status: DC

## 2015-10-25 MED ORDER — SODIUM CHLORIDE 0.9 % IV SOLN
INTRAVENOUS | Status: DC
  Administered 2015-10-25 – 2015-10-26 (×2): via INTRAVENOUS

## 2015-10-25 MED ORDER — PRAVASTATIN SODIUM 40 MG PO TABS
40.0000 mg | ORAL_TABLET | Freq: Every day | ORAL | Status: DC
  Administered 2015-10-25 – 2015-10-26 (×2): 40 mg via ORAL
  Filled 2015-10-25 (×2): qty 1

## 2015-10-25 MED ORDER — NITROGLYCERIN 0.4 MG SL SUBL: 0.4000 mg | SUBLINGUAL_TABLET | SUBLINGUAL | Status: DC | PRN

## 2015-10-25 MED ORDER — MORPHINE SULFATE (PF) 2 MG/ML IV SOLN: 2.0000 mg | INTRAVENOUS | Status: DC | PRN

## 2015-10-25 MED ORDER — ONDANSETRON HCL 4 MG PO TABS: 4.0000 mg | ORAL_TABLET | Freq: Four times a day (QID) | ORAL | Status: DC | PRN

## 2015-10-25 MED ORDER — PANTOPRAZOLE SODIUM 40 MG PO TBEC
40.0000 mg | DELAYED_RELEASE_TABLET | Freq: Every day | ORAL | Status: DC
  Administered 2015-10-26: 40 mg via ORAL
  Filled 2015-10-25: qty 1

## 2015-10-25 MED ORDER — HEPARIN SODIUM (PORCINE) 5000 UNIT/ML IJ SOLN
5000.0000 [IU] | Freq: Three times a day (TID) | INTRAMUSCULAR | Status: DC
  Administered 2015-10-25 – 2015-10-27 (×5): 5000 [IU] via SUBCUTANEOUS
  Filled 2015-10-25 (×5): qty 1

## 2015-10-25 MED ORDER — DOCUSATE SODIUM 100 MG PO CAPS
100.0000 mg | ORAL_CAPSULE | Freq: Two times a day (BID) | ORAL | Status: DC
  Administered 2015-10-25 – 2015-10-26 (×3): 100 mg via ORAL
  Filled 2015-10-25 (×3): qty 1

## 2015-10-25 NOTE — H&P (Signed)
History and Physical    Ane PaymentDonald D Laughridge Sr. ZOX:[id number] DOB: 09/12/1934 DOA: 10/25/2015  Referring physician: Dr. Pershing ProudSchaevitz PCP: Marisue IvanLINTHAVONG, KANHKA, MD  Specialists: none  Chief Complaint: Chest pain  HPI: Lonnie ClockDonald D Weckwerth Sr. is a 81 y.o. male has a past medical history significant for recurrent strokes. PVD with carotid and LE stents as well as ASCVD s/p MI with coronary stents now with 1-2 day hx of recurrent CP described as pressure. No radiation of pain. No associated N/V, SOB, or diaphoresis. Currently pain-free. EKG non-acute. Initial troponin negative. He is now place on observation for further evaluation.  Review of Systems: The patient denies anorexia, fever, weight loss,, vision loss, decreased hearing, hoarseness, syncope, dyspnea on exertion, peripheral edema, balance deficits, hemoptysis, abdominal pain, melena, hematochezia, severe indigestion/heartburn, hematuria, incontinence, genital sores, muscle weakness, suspicious skin lesions, transient blindness, difficulty walking, depression, unusual weight change, abnormal bleeding, enlarged lymph nodes, angioedema, and breast masses.   Past Medical History  Diagnosis Date  . Hypertension   . CVA (cerebral infarction)   . Heart attack (HCC)   . Hyperlipidemia    History reviewed. No pertinent past surgical history. Social History:  reports that he has been smoking.  He does not have any smokeless tobacco history on file. He reports that he does not drink alcohol or use illicit drugs.  No Known Allergies  History reviewed. No pertinent family history.  Prior to Admission medications   Not on File   Physical Exam: Filed Vitals:   10/25/15 1200 10/25/15 1435  BP: 126/76 140/76  Pulse: 64 58  Temp: 98.2 F (36.8 C)   TempSrc: Oral   Resp: 20 18  Height: 5\' 6"  (1.676 m)   Weight: 69.854 kg (154 lb)   SpO2: 99% 99%     General:  No apparent distress  Eyes: PERRL, EOMI, no scleral icterus  ENT: moist  oropharynx  Neck: supple, no lymphadenopathy. Left carotid bruit noted  Cardiovascular: regular rate without MRG; 1+ peripheral pulses, no JVD, no peripheral edema  Respiratory: CTA biL, good air movement without wheezing, rhonchi or crackled  Abdomen: soft, non tender to palpation, positive bowel sounds, no guarding, no rebound  Skin: no rashes  Musculoskeletal: normal bulk and tone, no joint swelling  Psychiatric: normal mood and affect  Neurologic: CN 2-12 grossly intact, MS 5/5 in all 4  Labs on Admission:  Basic Metabolic Panel:  Recent Labs Lab 10/25/15 1210  NA 138  K 4.1  CL 105  CO2 22  GLUCOSE 114*  BUN 17  CREATININE 1.40*  CALCIUM 9.2   Liver Function Tests: No results for input(s): AST, ALT, ALKPHOS, BILITOT, PROT, ALBUMIN in the last 168 hours. No results for input(s): LIPASE, AMYLASE in the last 168 hours. No results for input(s): AMMONIA in the last 168 hours. CBC:  Recent Labs Lab 10/25/15 1210  WBC 10.4  HGB 15.1  HCT 44.9  MCV 94.9  PLT 294   Cardiac Enzymes:  Recent Labs Lab 10/25/15 1210  TROPONINI <0.03    BNP (last 3 results) No results for input(s): BNP in the last 8760 hours.  ProBNP (last 3 results) No results for input(s): PROBNP in the last 8760 hours.  CBG: No results for input(s): GLUCAP in the last 168 hours.  Radiological Exams on Admission: Dg Chest 2 View  10/25/2015  CLINICAL DATA:  Chest pain. EXAM: CHEST  2 VIEW COMPARISON:  08/12/2014 and 07/11/2012 and 11/13/2011 FINDINGS: The heart size and mediastinal contours are  within normal limits. Both lungs are clear. The visualized skeletal structures are unremarkable. Nipple shadows at the lung bases. IMPRESSION: No active cardiopulmonary disease. Electronically Signed   By: Francene Boyers M.D.   On: 10/25/2015 13:57    EKG: Independently reviewed.  Assessment/Plan Active Problems:   Chest pain   Will observe on telemetry. Follow enzymes. Order echo. Consult  Cardiology. Continue outpatient meds for now.  Diet: heart healthy Fluids: NS@75  DVT Prophylaxis: SQ Heparin  Code Status: FULL  Family Communication: yes  Disposition Plan: home  Time spent: 45 min

## 2015-10-25 NOTE — ED Provider Notes (Signed)
[facility] Emergency Department Provider Note  ____________________________________________  Time seen: Approximately 2 PM  I have reviewed the triage vital signs and the nursing notes.   HISTORY  Chief Complaint Chest Pain    HPI Lonnie PaymentDonald D Wilborn Sr. is a 81 y.o. male with a history of hypertension, CAD with stents who is presenting today with 2-3 episodes of chest pain over the past day. He says the pain feels like a squeezing pain and does not radiate. He denies it being associated with any nausea, vomiting or diaphoresis. He says it is not associated with movement. Says that it can happen at rest. He is pain-free at this time. Says that it lasted about an hour each episode with the last episode being this morning. Also has back pain to the low back. His wife said that he also had several episodes where he was just staring blankly into space earlier today for several seconds. She says this usually happens before he has events like a heart attack or stroke which has had in the past. He had his st[facility] the [facility].   Past Medical History  Diagnosis Date  . Hypertension   . CVA (cerebral infarction)   . Heart attack (HCC)   . Hyperlipidemia     There are no active problems to display for this patient.   History reviewed. No pertinent past surgical history.  No current outpatient prescriptions on file.  Allergies Review of patient's allergies indicates no known allergies.  History reviewed. No pertinent family history.  Social History Social History  Substance Use Topics  . Smoking status: Current Every Day Smoker  . Smokeless tobacco: None  . Alcohol Use: No    Review of Systems Constitutional: No fever/chills Eyes: No visual changes. ENT: No sore throat. Cardiovascular: As above Respiratory: Denies shortness of breath. Gastrointestinal: No abdominal pain.  No nausea, no vomiting.  No diarrhea.  No  constipation. Genitourinary: Negative for dysuria. Musculoskeletal: As above .  Back pain does not radiate down his leg. No loss of bowel or bladder continence. Skin: Negative for rash. Neurological: Negative for headaches, focal weakness or numbness.  10-point ROS otherwise negative.  ____________________________________________   PHYSICAL EXAM:  VITAL SIGNS: ED Triage Vitals  Enc Vitals Group     BP 10/25/15 1200 126/76 mmHg     Pulse Rate 10/25/15 1200 64     Resp 10/25/15 1200 20     Temp 10/25/15 1200 98.2 F (36.8 C)     Temp Source 10/25/15 1200 Oral     SpO2 10/25/15 1200 99 %     Weight 10/25/15 1200 154 lb (69.854 kg)     Height 10/25/15 1200 5\' 6"  (1.676 m)     Head Cir --      Peak Flow --      Pain Score 10/25/15 1201 10     Pain Loc --      Pain Edu? --      Excl. in GC? --     Constitutional: Alert and oriented. Well appearing and in no acute distress. Eyes: Conjunctivae are normal. PERRL. EOMI. Head: Atraumatic. Nose: No congestion/rhinnorhea. Mouth/Throat: Mucous membranes are moist.  Oropharynx non-erythematous. Neck: No stridor.   Cardiovascular: Normal rate, regular rhythm. Grossly normal heart sounds.  Good peripheral circulation. Chest pain is not reproducible palpation. Respiratory: Normal respiratory effort.  No retractions. Lungs CTAB. Gastrointestinal: Soft and nontender. No distention. No abdominal bruits. No CVA tenderness. Musculoskeletal:  No lower extremity tenderness nor edema.  No joint effusions. Neurologic:  Normal speech and language. No gross focal neurologic deficits are appreciated. No gait instability. Skin:  Skin is warm, dry and intact. No rash noted. Psychiatric: Mood and affect are normal. Speech and behavior are normal.  ____________________________________________   LABS (all labs ordered are listed, but only abnormal results are displayed)  Labs Reviewed  BASIC METABOLIC PANEL - Abnormal; Notable for the following:     Glucose, Bld 114 (*)    Creatinine, Ser 1.40 (*)    GFR calc non Af Amer 46 (*)    GFR calc Af Amer 54 (*)    All other components within normal limits  TROPONIN I  CBC   ____________________________________________  EKG  ED ECG REPORT I, Arelia Longest, the attending physician, personally viewed and interpreted this ECG.   Date: 10/25/2015  EKG Time: 1201  Rate:  63  Rhythm: normal sinus rhythm  Axis: Normal axis  Intervals:right bundle branch block  ST&T Change: No ST elevations or depressions. No abnormal T-wave inversions.  ____________________________________________  RADIOLOGY  No active cardiopulmonary disease. I personally reviewed these films. ____________________________________________   PROCEDURES   ____________________________________________   INITIAL IMPRESSION / ASSESSMENT AND PLAN / ED COURSE  Pertinent labs & imaging results that were available during my care of the patient were reviewed by me and considered in my medical decision making (see chart for details).  Patient with a heart score of 6. This puts him in a very high risk group for poor outcome of sent home. Extended the patient that he will be admitted to the hospital. Aspirin ordered. Continues to be pain-free. Signed out to Dr. Judithann Sheen. ____________________________________________   FINAL CLINICAL IMPRESSION(S) / ED DIAGNOSES  Chest pain.    Myrna Blazer, MD 10/25/15 1430

## 2015-10-25 NOTE — ED Notes (Signed)
Pt to ed with c/o chest pain that started yesterday, intermittent.  Reports pain is primarily on left side of chest and does not radiate,  Pt states weakness, dizziness associated with tight chest pain.

## 2015-10-25 NOTE — ED Notes (Signed)
Patient transported to room 259-A

## 2015-10-26 ENCOUNTER — Encounter: Payer: Self-pay | Admitting: Internal Medicine

## 2015-10-26 ENCOUNTER — Observation Stay
Admit: 2015-10-26 | Discharge: 2015-10-26 | Disposition: A | Discharge status: Home / Self Care | Payer: Medicare Other | Attending: Internal Medicine | Admitting: Internal Medicine

## 2015-10-26 LAB — COMPREHENSIVE METABOLIC PANEL
ALBUMIN: 3.3 g/dL — AB (ref 3.5–5.0)
ALT: 9 U/L — ABNORMAL LOW (ref 17–63)
AST: 16 U/L (ref 15–41)
Alkaline Phosphatase: 60 U/L (ref 38–126)
Anion gap: 7 (ref 5–15)
BILIRUBIN TOTAL: 0.4 mg/dL (ref 0.3–1.2)
BUN: 21 mg/dL — AB (ref 6–20)
CALCIUM: 8.5 mg/dL — AB (ref 8.9–10.3)
CO2: 24 mmol/L (ref 22–32)
CREATININE: 1.55 mg/dL — AB (ref 0.61–1.24)
Chloride: 108 mmol/L (ref 101–111)
GFR calc Af Amer: 47 mL/min — ABNORMAL LOW (ref >60)
GFR, EST NON AFRICAN AMERICAN: 41 mL/min — AB (ref >60)
GLUCOSE: 106 mg/dL — AB (ref 65–99)
Potassium: 3.7 mmol/L (ref 3.5–5.1)
SODIUM: 139 mmol/L (ref 135–145)
TOTAL PROTEIN: 6.8 g/dL (ref 6.5–8.1)

## 2015-10-26 LAB — TROPONIN I

## 2015-10-26 LAB — CBC
HCT: 43.2 % (ref 40.0–52.0)
Hemoglobin: 14.2 g/dL (ref 13.0–18.0)
MCH: 31.2 pg (ref 26.0–34.0)
MCHC: 32.8 g/dL (ref 32.0–36.0)
MCV: 94.9 fL (ref 80.0–100.0)
PLATELETS: 257 10*3/uL (ref 150–440)
RBC: 4.55 MIL/uL (ref 4.40–5.90)
RDW: 13.9 % (ref 11.5–14.5)
WBC: 8.9 10*3/uL (ref 3.8–10.6)

## 2015-10-26 NOTE — Progress Notes (Signed)
[facility] [place] [facility]   PATIENT NAME: Lonnie DrainDonald [name]    MR#:  [id number]  DATE OF BIRTH:  09/10/1934  SUBJECTIVE:  CHIEF COMPLAINT:   Chief Complaint  Patient presents with  . Chest Pain  No further chest pain  REVIEW OF SYSTEMS:    Review of Systems  Constitutional: Negative for fever and chills.  HENT: Negative for sore throat.   Eyes: Negative for blurred vision, double vision and pain.  Respiratory: Negative for cough, hemoptysis, shortness of breath and wheezing.   Cardiovascular: Negative for chest pain, palpitations, orthopnea and leg swelling.  Gastrointestinal: Negative for heartburn, nausea, vomiting, abdominal pain, diarrhea and constipation.  Genitourinary: Negative for dysuria and hematuria.  Musculoskeletal: Negative for back pain and joint pain.  Skin: Negative for rash.  Neurological: Negative for sensory change, speech change, focal weakness and headaches.  Endo/Heme/Allergies: Does not bruise/bleed easily.  Psychiatric/Behavioral: Negative for depression. The patient is not nervous/anxious.     DRUG ALLERGIES:   Allergies  Allergen Reactions  . Oxycodone-Acetaminophen Nausea Only    Vomiting.  . Simvastatin     Other reaction(s): Other (See Comments) Questionable.    VITALS:  Blood pressure 135/55, pulse 80, temperature 97.8 F (36.6 C), temperature source Oral, resp. rate 18, height 5\' 6"  (1.676 m), weight 69.542 kg (153 lb 5 oz), SpO2 93 %.  PHYSICAL EXAMINATION:   Physical Exam  GENERAL:  81 y.o.-year-old patient lying in the bed with no acute distress.  EYES: Pupils equal, round, reactive to light and accommodation. No scleral icterus. Extraocular muscles intact.  HEENT: Head atraumatic, normocephalic. Oropharynx and nasopharynx clear.  NECK:  Supple, no jugular venous distention. No thyroid enlargement, no tenderness.  LUNGS: Normal breath sounds bilaterally, no wheezing, rales, rhonchi. No use of  accessory muscles of respiration.  CARDIOVASCULAR: S1, S2 normal. No murmurs, rubs, or gallops.  ABDOMEN: Soft, nontender, nondistended. Bowel sounds present. No organomegaly or mass.  EXTREMITIES: No cyanosis, clubbing or edema b/l.    NEUROLOGIC: Cranial nerves II through XII are intact. No focal Motor or sensory deficits b/l.   PSYCHIATRIC: The patient is alert and oriented x 3.  SKIN: No obvious rash, lesion, or ulcer.    LABORATORY PANEL:   CBC  Recent Labs Lab 10/26/15 0509  WBC 8.9  HGB 14.2  HCT 43.2  PLT 257   ------------------------------------------------------------------------------------------------------------------  Chemistries   Recent Labs Lab 10/26/15 0509  NA 139  K 3.7  CL 108  CO2 24  GLUCOSE 106*  BUN 21*  CREATININE 1.55*  CALCIUM 8.5*  AST 16  ALT 9*  ALKPHOS 60  BILITOT 0.4   ------------------------------------------------------------------------------------------------------------------  Cardiac Enzymes  Recent Labs Lab 10/26/15 0509  TROPONINI <0.03   ------------------------------------------------------------------------------------------------------------------  RADIOLOGY:  Dg Chest 2 View  10/25/2015  CLINICAL DATA:  Chest pain. EXAM: CHEST  2 VIEW COMPARISON:  08/12/2014 and 07/11/2012 and 11/13/2011 FINDINGS: The heart size and mediastinal contours are within normal limits. Both lungs are clear. The visualized skeletal structures are unremarkable. Nipple shadows at the lung bases. IMPRESSION: No active cardiopulmonary disease. Electronically Signed   By: Francene BoyersJames  Maxwell M.D.   On: 10/25/2015 13:57     ASSESSMENT AND PLAN:   * Chest pain Concerning for angina Discussed with Dr. Juliann Paresallwood. Troponin normal. Will get Stress test in AM.  * HTN Continue medications  * CKD3 Stable  * DVT prophylaxis   All the records are reviewed and case discussed with Care Management/Social Workerr.  Management plans discussed  with the patient, family and they are in agreement.  CODE STATUS: FULL  DVT Prophylaxis: SCDs  TOTAL TIME TAKING CARE OF THIS PATIENT: 35 minutes.   POSSIBLE D/C IN 1-2 DAYS, DEPENDING ON CLINICAL CONDITION.   Milagros Loll R M.D on 10/26/2015 at 1:59 PM  Between 7am to 6pm - Pager - [phone]  After 6pm go to [email or web address] - password [email or web address]  [facility]  Office  [phone]  CC: Primary care physician; Marisue Ivan, MD    Note: This dictation was prepared with Dragon dictation along with smaller phrase technology. Any transcriptional errors that result from this process are unintentional.

## 2015-10-26 NOTE — Consult Note (Signed)
Reason for Consult: angina chest pain coronary disease Referring Physician:  Dr. Doy Hutching hospitalist  Lonnie Maidens Sr. is an 81 y.o. male.  HPI:  The patient has a history of known coronary disease peripheral vascular disease hypertension smoking hyperlipidemia who presents with chest pain symptoms states that had PCI and stents last     One was 1993.  Patient started having midsternal chest discomfort with shortness of breath so he finally came in for evaluation. Patient states this pain is different from when he had his stents and angina he had pain mostly in his back and neck. Now the pain is substernal pressure at its worst about 8/10. Patient denies any black or spells syncope no significant nausea vomiting. He is currently pain-free now. And no recent stress test no recent catheterization no recent cardiac evaluation. Patient is still smoking states to be compliant with his medication  Past Medical History  Diagnosis Date  . Hypertension   . CVA (cerebral infarction)   . Heart attack ([place])   . Hyperlipidemia   . CKD (chronic kidney disease) stage 3, GFR 30-59 ml/min     History reviewed. No pertinent past surgical history.  History reviewed. No pertinent family history.  Social History:  reports that he has been smoking.  He does not have any smokeless tobacco history on file. He reports that he does not drink alcohol or use illicit drugs.  Allergies:  Allergies  Allergen Reactions  . Oxycodone-Acetaminophen Nausea Only    Vomiting.  . Simvastatin     Other reaction(s): Other (See Comments) Questionable.    Medications: I have reviewed the patient's current medications.  Results for orders placed or performed during the hospital encounter of 10/25/15 (from the past 48 hour(s))  Basic metabolic panel     Status: Abnormal   Collection Time: 10/25/15 12:10 PM  Result Value Ref Range   Sodium 138 135 - 145 mmol/L   Potassium 4.1 3.5 - 5.1 mmol/L   Chloride 105 101 - 111  mmol/L   CO2 22 22 - 32 mmol/L   Glucose, Bld 114 (H) 65 - 99 mg/dL   BUN 17 6 - 20 mg/dL   Creatinine, Ser 1.40 (H) 0.61 - 1.24 mg/dL   Calcium 9.2 8.9 - 10.3 mg/dL   GFR calc non Af Amer 46 (L) >60 mL/min   GFR calc Af Amer 54 (L) >60 mL/min    Comment: (NOTE) The eGFR has been calculated using the CKD EPI equation. This calculation has not been validated in all clinical situations. eGFR's persistently <60 mL/min signify possible Chronic Kidney Disease.    Anion gap 11 5 - 15  Troponin I     Status: None   Collection Time: 10/25/15 12:10 PM  Result Value Ref Range   Troponin I <0.03 <0.031 ng/mL    Comment:        NO INDICATION OF MYOCARDIAL INJURY.   CBC     Status: None   Collection Time: 10/25/15 12:10 PM  Result Value Ref Range   WBC 10.4 3.8 - 10.6 K/uL   RBC 4.73 4.40 - 5.90 MIL/uL   Hemoglobin 15.1 13.0 - 18.0 g/dL   HCT 44.9 40.0 - 52.0 %   MCV 94.9 80.0 - 100.0 fL   MCH 31.8 26.0 - 34.0 pg   MCHC 33.6 32.0 - 36.0 g/dL   RDW 14.1 11.5 - 14.5 %   Platelets 294 150 - 440 K/uL  Troponin I     Status:  None   Collection Time: 10/25/15  4:59 PM  Result Value Ref Range   Troponin I <0.03 <0.031 ng/mL    Comment:        NO INDICATION OF MYOCARDIAL INJURY.   Troponin I     Status: None   Collection Time: 10/25/15 10:53 PM  Result Value Ref Range   Troponin I <0.03 <0.031 ng/mL    Comment:        NO INDICATION OF MYOCARDIAL INJURY.   Troponin I     Status: None   Collection Time: 10/26/15  5:09 AM  Result Value Ref Range   Troponin I <0.03 <0.031 ng/mL    Comment:        NO INDICATION OF MYOCARDIAL INJURY.   Comprehensive metabolic panel     Status: Abnormal   Collection Time: 10/26/15  5:09 AM  Result Value Ref Range   Sodium 139 135 - 145 mmol/L   Potassium 3.7 3.5 - 5.1 mmol/L   Chloride 108 101 - 111 mmol/L   CO2 24 22 - 32 mmol/L   Glucose, Bld 106 (H) 65 - 99 mg/dL   BUN 21 (H) 6 - 20 mg/dL   Creatinine, Ser 1.55 (H) 0.61 - 1.24 mg/dL    Calcium 8.5 (L) 8.9 - 10.3 mg/dL   Total Protein 6.8 6.5 - 8.1 g/dL   Albumin 3.3 (L) 3.5 - 5.0 g/dL   AST 16 15 - 41 U/L   ALT 9 (L) 17 - 63 U/L   Alkaline Phosphatase 60 38 - 126 U/L   Total Bilirubin 0.4 0.3 - 1.2 mg/dL   GFR calc non Af Amer 41 (L) >60 mL/min   GFR calc Af Amer 47 (L) >60 mL/min    Comment: (NOTE) The eGFR has been calculated using the CKD EPI equation. This calculation has not been validated in all clinical situations. eGFR's persistently <60 mL/min signify possible Chronic Kidney Disease.    Anion gap 7 5 - 15  CBC     Status: None   Collection Time: 10/26/15  5:09 AM  Result Value Ref Range   WBC 8.9 3.8 - 10.6 K/uL   RBC 4.55 4.40 - 5.90 MIL/uL   Hemoglobin 14.2 13.0 - 18.0 g/dL   HCT 43.2 40.0 - 52.0 %   MCV 94.9 80.0 - 100.0 fL   MCH 31.2 26.0 - 34.0 pg   MCHC 32.8 32.0 - 36.0 g/dL   RDW 13.9 11.5 - 14.5 %   Platelets 257 150 - 440 K/uL    Dg Chest 2 View  10/25/2015  CLINICAL DATA:  Chest pain. EXAM: CHEST  2 VIEW COMPARISON:  08/12/2014 and 07/11/2012 and 11/13/2011 FINDINGS: The heart size and mediastinal contours are within normal limits. Both lungs are clear. The visualized skeletal structures are unremarkable. Nipple shadows at the lung bases. IMPRESSION: No active cardiopulmonary disease. Electronically Signed   By: Lorriane Shire M.D.   On: 10/25/2015 13:57    Review of Systems  Constitutional: Positive for malaise/fatigue.  HENT: Positive for congestion.   Eyes: Negative.   Respiratory: Positive for shortness of breath.   Cardiovascular: Positive for chest pain, orthopnea and claudication.  Gastrointestinal: Negative.   Genitourinary: Negative.   Musculoskeletal: Positive for myalgias and joint pain.  Neurological: Positive for weakness.  Endo/Heme/Allergies: Negative.   Psychiatric/Behavioral: Negative.    Blood pressure 135/55, pulse 80, temperature 97.8 F (36.6 C), temperature source Oral, resp. rate 18, height 5' 6"  (1.676 m),  weight 69.542 kg (  153 lb 5 oz), SpO2 93 %. Physical Exam  Nursing note and vitals reviewed. Constitutional: He is oriented to person, place, and time. He appears well-developed and well-nourished.  HENT:  Head: Normocephalic and atraumatic.  Eyes: Conjunctivae and EOM are normal. Pupils are equal, round, and reactive to light.  Neck: Normal range of motion. Neck supple.  Cardiovascular: Normal rate and regular rhythm.   Murmur heard. Respiratory: Effort normal and breath sounds normal.  GI: Soft. Bowel sounds are normal.  Musculoskeletal: Normal range of motion.  Neurological: He is alert and oriented to person, place, and time. He has normal reflexes.  Skin: Skin is warm and dry.  Psychiatric: He has a normal mood and affect.    Assessment/Plan:  angina  coronary disease  hypertension  peripheral vascular disease  hyperlipidemia  GERD  Chronic obstructive pulmonary disease  chronic renal insufficiency . PLAN Agree with  myocardial infarction  continue pravachol for lipid management  continue Protonix for GERD symptoms  maintain metoprolol add Imdur for angina  blood pressure control with amlodipine metoprolol  echocardiogram for assessment of function  consider nephrology evaluation with chronic renal sufficiency  continue Plavix and aspirin Advise the patient to quit smoking  As art of 2nd intervention  recommend functional study for evaluation of angina Defer cardiac  ctheterization for now unless functional study positive   Aundra Pung D. 10/26/2015, 4:37 PM

## 2015-10-26 NOTE — Discharge Instructions (Signed)

## 2015-10-26 NOTE — Progress Notes (Signed)
Subjective:   patient states to feel reasonably well no significant chest pain no significant shortness of breath has been ambulatory for short distances on hospital and feels reasonably well  Objective:  Vital Signs in the last 24 hours: Temp:  [97.5 F (36.4 C)-98.2 F (36.8 C)] 97.8 F (36.6 C) (11/20 1133) Pulse Rate:  [65-80] 80 (11/20 1133) Resp:  [14-18] 18 (11/20 1133) BP: (113-174)/(43-69) 135/55 mmHg (11/20 1133) SpO2:  [93 %-98 %] 93 % (11/20 1133) Weight:  [69.542 kg (153 lb 5 oz)] 69.542 kg (153 lb 5 oz) (11/20 0500)  Intake/Output from previous day: 11/19 0701 - 11/20 0700 In: 1135 [P.O.:240; I.V.:895] Out: 650 [Urine:650] Intake/Output from this shift: Total I/O In: 240 [P.O.:240] Out: 200 [Urine:200]  Physical Exam: General appearance: alert, cooperative and appears stated age   HEENT  Within normal limits. Pupils equally reactive to light  neck  Supple. No significant JVD bilateral bruits  lungs  Clear  To auscultation and percussion with bilateral rhonchi and no wheezing no rales             adequate air movement HEART  Regular rhythm systolic ejection murmur along the left sternal border.  PMI nondisplaced positive S4 ABD  Abdomen is abdomen is benign 5 about some new bone that no tenderness  extremities  Decreased pulses bilaterally bilateral bruise in the femoral area no edema  Neuro  Within normal limits normal cranial nerves.  Normal reflexes normal strength  Lab Results:  Recent Labs  10/25/15 1210 10/26/15 0509  WBC 10.4 8.9  HGB 15.1 14.2  PLT 294 257    Recent Labs  10/25/15 1210 10/26/15 0509  NA 138 139  K 4.1 3.7  CL 105 108  CO2 22 24  GLUCOSE 114* 106*  BUN 17 21*  CREATININE 1.40* 1.55*    Recent Labs  10/25/15 2253 10/26/15 0509  TROPONINI <0.03 <0.03   Hepatic Function Panel  Recent Labs  10/26/15 0509  PROT 6.8  ALBUMIN 3.3*  AST 16  ALT 9*  ALKPHOS 60  BILITOT 0.4   No results for input(s): CHOL in the  last 72 hours. No results for input(s): PROTIME in the last 72 hours.  Imaging: Imaging results have been reviewed  Cardiac Studies:  Assessment/Plan:  Angina Chest Pain Coronary Artery Disease Coronary Artery Stent Ischemic Heart Disease Shortness of Breath   smoking  chronic renal insufficiency  hyperlipidemia . PLAN  patient is ruled out for myocardial infarction  continue telemetry  continue medical therapy  agree with metoprolol and amlodipine  continue nitrates  continue Protonix for reflux symptoms  recommend continue Pravachol for lipid management  proceed with functional study in the morning Lexius scan myoview for evaluation of angina  advised patient to quit smoking  increase activity in the halls  recommend medical therapy for now  would prefer cardiac catheterization unless  symptoms persist or Myoview grossly abnormal     Kassius Battiste D. 10/26/2015, 4:54 PM

## 2015-10-26 NOTE — Progress Notes (Signed)
*  PRELIMINARY RESULTS* Echocardiogram 2D Echocardiogram has been performed.  Georgann HousekeeperJerry R Hege 10/26/2015, 10:01 AM

## 2015-10-27 ENCOUNTER — Observation Stay: Payer: Medicare Other

## 2015-10-27 ENCOUNTER — Encounter: Payer: Self-pay | Admitting: Radiology

## 2015-10-27 LAB — NM MYOCAR MULTI W/SPECT W/WALL MOTION / EF
CHL CUP NUCLEAR SDS: 4
CHL CUP NUCLEAR SRS: 3
CHL CUP NUCLEAR SSS: 5
LV dias vol: 46 mL
LV sys vol: 18 mL
TID: 1

## 2015-10-27 MED ORDER — REGADENOSON 0.4 MG/5ML IV SOLN
0.4000 mg | Freq: Once | INTRAVENOUS | Status: AC
  Administered 2015-10-27: 0.4 mg via INTRAVENOUS

## 2015-10-27 MED ORDER — TECHNETIUM TC 99M SESTAMIBI - CARDIOLITE
32.7480 | Freq: Once | INTRAVENOUS | Status: AC | PRN
  Administered 2015-10-27: 12:00:00 32.748 via INTRAVENOUS

## 2015-10-27 MED ORDER — TECHNETIUM TC 99M SESTAMIBI - CARDIOLITE
13.0730 | Freq: Once | INTRAVENOUS | Status: AC | PRN
  Administered 2015-10-27: 13.073 via INTRAVENOUS

## 2015-11-03 NOTE — Discharge Summary (Signed)
[facility]  at [facility]   PATIENT NAME: Lonnie DrainDonald [name]    MR#:  [id number]  DATE OF BIRTH:  09/10/1934  DATE OF ADMISSION:  10/25/2015 ADMITTING PHYSICIAN: Marguarite ArbourJeffrey D Sparks, MD  DATE OF DISCHARGE: 10/27/2015  3:08 PM  PRIMARY CARE PHYSICIAN: Marisue IvanLINTHAVONG, KANHKA, MD    ADMISSION DIAGNOSIS:  Chest pain, unspecified chest pain type [R07.9]  DISCHARGE DIAGNOSIS:  Active Problems:   Chest pain   SECONDARY DIAGNOSIS:   Past Medical History  Diagnosis Date  . Hypertension   . CVA (cerebral infarction)   . Heart attack (HCC)   . Hyperlipidemia   . CKD (chronic kidney disease) stage 3, GFR 30-59 ml/min      ADMITTING HISTORY  Chief Complaint: Chest pain  HPI: Lonnie ClockDonald D Armstrong Sr. is a 81 y.o. male has a past medical history significant for recurrent strokes. PVD with carotid and LE stents as well as ASCVD s/p MI with coronary stents now with 1-2 day hx of recurrent CP described as pressure. No radiation of pain. No associated N/V, SOB, or diaphoresis. Currently pain-free. EKG non-acute. Initial troponin negative. He is now place on observation for further evaluation.   HOSPITAL COURSE:   * Chest pain Concerning for angina Discussed with Dr. Juliann Paresallwood. Troponin normal. Stress test did not show any reversible ischemic changes. Due to his CKD cardiac catheterization not preferred by Dr. Juliann Paresallwood. Patient has ambulated well in the hallways without any pain in his chest. No arrhythmias on telemetry. An discharged home in stable condition.  * HTN Continue medications  * CKD3 Stable  * DVT prophylaxis  Follow-up with PCP and cardiology in 1 week.   CONSULTS OBTAINED:  Treatment Team:  Alwyn Peawayne D Callwood, MD  DRUG ALLERGIES:   Allergies  Allergen Reactions  . Oxycodone-Acetaminophen Nausea Only    Vomiting.  . Simvastatin     Other reaction(s): Other (See Comments) Questionable.    DISCHARGE MEDICATIONS:   Discharge  Medication List as of 10/27/2015  2:49 PM    CONTINUE these medications which have NOT CHANGED   Details  amLODipine (NORVASC) 5 MG tablet Take 5 mg by mouth every morning., Starting 09/24/2015, Until Discontinued, Historical Med    clopidogrel (PLAVIX) 75 MG tablet Take 75 mg by mouth daily., Starting 08/20/2015, Until Discontinued, Historical Med    folic acid (FOLVITE) 1 MG tablet Take 1 mg by mouth daily., Starting 10/20/2015, Until Discontinued, Historical Med    furosemide (LASIX) 20 MG tablet Take 20 mg by mouth daily., Starting 10/18/2015, Until Discontinued, Historical Med    lovastatin (MEVACOR) 40 MG tablet Take 40 mg by mouth at bedtime., Starting 09/09/2015, Until Discontinued, Historical Med    metoprolol (LOPRESSOR) 50 MG tablet Take 50 mg by mouth 2 (two) times daily., Starting 09/09/2015, Until Discontinued, Historical Med         Today    VITAL SIGNS:  Blood pressure 108/71, pulse 75, temperature 98.8 F (37.1 C), temperature source Oral, resp. rate 18, height 5\' 6"  (1.676 m), weight 68.085 kg (150 lb 1.6 oz), SpO2 98 %.  I/O:  No intake or output data in the 24 hours ending 11/03/15 1350  PHYSICAL EXAMINATION:  Physical Exam  GENERAL:  81 y.o.-year-old patient lying in the bed with no acute distress.  LUNGS: Normal breath sounds bilaterally, no wheezing, rales,rhonchi or crepitation. No use of accessory muscles of respiration.  CARDIOVASCULAR: S1, S2 normal. No murmurs, rubs, or gallops.  ABDOMEN: Soft, non-tender, non-distended. Bowel sounds  present. No organomegaly or mass.  NEUROLOGIC: Moves all 4 extremities. PSYCHIATRIC: The patient is alert and oriented x 3.  SKIN: No obvious rash, lesion, or ulcer.   DATA REVIEW:   CBC No results for input(s): WBC, HGB, HCT, PLT in the last 168 hours.  Chemistries  No results for input(s): NA, K, CL, CO2, GLUCOSE, BUN, CREATININE, CALCIUM, MG, AST, ALT, ALKPHOS, BILITOT in the last 168 hours.  Invalid input(s):  GFRCGP  Cardiac Enzymes No results for input(s): TROPONINI in the last 168 hours.  Microbiology Results  No results found for this or any previous visit.  RADIOLOGY:  No results found.    Follow up with PCP in 1 week.  Management plans discussed with the patient, family and they are in agreement.  CODE STATUS:   TOTAL TIME TAKING CARE OF THIS PATIENT ON DAY OF DISCHARGE: more than 30 minutes.    Milagros Loll R M.D on 11/03/2015 at 1:50 PM  Between 7am to 6pm - Pager - [phone]  After 6pm go to [email or web address] - password [email or web address] [facility]  [place] [facility]  Office  [phone]  CC: Primary care physician; Marisue Ivan, MD     Note: This dictation was prepared with Dragon dictation along with smaller phrase technology. Any transcriptional errors that result from this process are unintentional.

## 2017-08-16 ENCOUNTER — Encounter: Payer: Self-pay | Admitting: Emergency Medicine

## 2017-08-16 ENCOUNTER — Emergency Department: Payer: Medicaid Other

## 2017-08-16 ENCOUNTER — Observation Stay
Admission: EM | Admit: 2017-08-16 | Discharge: 2017-08-17 | Disposition: A | Discharge status: Home / Self Care | Payer: Medicaid Other | Attending: Internal Medicine | Admitting: Internal Medicine

## 2017-08-16 DIAGNOSIS — R062 Wheezing: Secondary | ICD-10-CM | POA: Diagnosis present

## 2017-08-16 DIAGNOSIS — R079 Chest pain, unspecified: Secondary | ICD-10-CM | POA: Diagnosis not present

## 2017-08-16 DIAGNOSIS — Z79899 Other long term (current) drug therapy: Secondary | ICD-10-CM | POA: Diagnosis not present

## 2017-08-16 DIAGNOSIS — F17210 Nicotine dependence, cigarettes, uncomplicated: Secondary | ICD-10-CM | POA: Insufficient documentation

## 2017-08-16 DIAGNOSIS — I509 Heart failure, unspecified: Secondary | ICD-10-CM | POA: Diagnosis not present

## 2017-08-16 DIAGNOSIS — Z8673 Personal history of transient ischemic attack (TIA), and cerebral infarction without residual deficits: Secondary | ICD-10-CM | POA: Diagnosis not present

## 2017-08-16 DIAGNOSIS — Z889 Allergy status to unspecified drugs, medicaments and biological substances status: Secondary | ICD-10-CM | POA: Insufficient documentation

## 2017-08-16 DIAGNOSIS — Z7902 Long term (current) use of antithrombotics/antiplatelets: Secondary | ICD-10-CM | POA: Insufficient documentation

## 2017-08-16 DIAGNOSIS — I252 Old myocardial infarction: Secondary | ICD-10-CM | POA: Insufficient documentation

## 2017-08-16 DIAGNOSIS — N183 Chronic kidney disease, stage 3 unspecified: Secondary | ICD-10-CM | POA: Diagnosis present

## 2017-08-16 DIAGNOSIS — E785 Hyperlipidemia, unspecified: Secondary | ICD-10-CM | POA: Diagnosis present

## 2017-08-16 DIAGNOSIS — Z955 Presence of coronary angioplasty implant and graft: Secondary | ICD-10-CM | POA: Insufficient documentation

## 2017-08-16 DIAGNOSIS — I2510 Atherosclerotic heart disease of native coronary artery without angina pectoris: Secondary | ICD-10-CM | POA: Diagnosis not present

## 2017-08-16 DIAGNOSIS — I130 Hypertensive heart and chronic kidney disease with heart failure and stage 1 through stage 4 chronic kidney disease, or unspecified chronic kidney disease: Secondary | ICD-10-CM | POA: Diagnosis not present

## 2017-08-16 DIAGNOSIS — R531 Weakness: Secondary | ICD-10-CM | POA: Diagnosis not present

## 2017-08-16 DIAGNOSIS — R4182 Altered mental status, unspecified: Secondary | ICD-10-CM | POA: Insufficient documentation

## 2017-08-16 DIAGNOSIS — I10 Essential (primary) hypertension: Secondary | ICD-10-CM | POA: Diagnosis present

## 2017-08-16 HISTORY — DX: Heart failure, unspecified: I50.9

## 2017-08-16 LAB — CBC
HCT: 41.3 % (ref 40.0–52.0)
HEMOGLOBIN: 14.1 g/dL (ref 13.0–18.0)
MCH: 32.6 pg (ref 26.0–34.0)
MCHC: 34 g/dL (ref 32.0–36.0)
MCV: 95.8 fL (ref 80.0–100.0)
Platelets: 232 10*3/uL (ref 150–440)
RBC: 4.31 MIL/uL — AB (ref 4.40–5.90)
RDW: 13.9 % (ref 11.5–14.5)
WBC: 13.4 10*3/uL — AB (ref 3.8–10.6)

## 2017-08-16 LAB — URINALYSIS, COMPLETE (UACMP) WITH MICROSCOPIC
BILIRUBIN URINE: NEGATIVE
Bacteria, UA: NONE SEEN
GLUCOSE, UA: NEGATIVE mg/dL
KETONES UR: NEGATIVE mg/dL
LEUKOCYTES UA: NEGATIVE
NITRITE: NEGATIVE
PROTEIN: 30 mg/dL — AB
Specific Gravity, Urine: 1.016 (ref 1.005–1.030)
pH: 5 (ref 5.0–8.0)

## 2017-08-16 LAB — BASIC METABOLIC PANEL
ANION GAP: 10 (ref 5–15)
BUN: 18 mg/dL (ref 6–20)
CALCIUM: 8.3 mg/dL — AB (ref 8.9–10.3)
CO2: 21 mmol/L — ABNORMAL LOW (ref 22–32)
Chloride: 103 mmol/L (ref 101–111)
Creatinine, Ser: 1.64 mg/dL — ABNORMAL HIGH (ref 0.61–1.24)
GFR, EST AFRICAN AMERICAN: 44 mL/min — AB (ref >60)
GFR, EST NON AFRICAN AMERICAN: 38 mL/min — AB (ref >60)
Glucose, Bld: 201 mg/dL — ABNORMAL HIGH (ref 65–99)
POTASSIUM: 3.7 mmol/L (ref 3.5–5.1)
Sodium: 134 mmol/L — ABNORMAL LOW (ref 135–145)

## 2017-08-16 LAB — TROPONIN I: Troponin I: <0.03 ng/mL (ref <0.03)

## 2017-08-16 MED ORDER — IPRATROPIUM-ALBUTEROL 0.5-2.5 (3) MG/3ML IN SOLN
3.0000 mL | Freq: Once | RESPIRATORY_TRACT | Status: AC
  Administered 2017-08-16: 3 mL via RESPIRATORY_TRACT
  Filled 2017-08-16: qty 3

## 2017-08-16 MED ORDER — ASPIRIN 81 MG PO CHEW
324.0000 mg | CHEWABLE_TABLET | Freq: Once | ORAL | Status: AC
  Administered 2017-08-16: 324 mg via ORAL
  Filled 2017-08-16: qty 4

## 2017-08-16 NOTE — H&P (Signed)
[facility] - [place] at [facility]   PATIENT NAME: Lonnie [name]    MR#:  [id number]  DATE OF BIRTH:  1934/12/05  DATE OF ADMISSION:  08/16/2017  PRIMARY CARE PHYSICIAN: Marisue Ivan, MD   REQUESTING/REFERRING PHYSICIAN: Sharma Covert, MD  CHIEF COMPLAINT:   Chief Complaint  Patient presents with  . Weakness    HISTORY OF PRESENT ILLNESS:  Lonnie [name]  is a 83 y.o. male who presents with weakness, recurring chest pain. Patient states that for the past several days he's had these symptoms of chest pain with radiation to his left arm associated with nausea and diaphoresis. He has significant cardiac history including multiple stents in the past. Hospitals were called for admission and further evaluation  PAST MEDICAL HISTORY:   Past Medical History:  Diagnosis Date  . CKD (chronic kidney disease) stage 3, GFR 30-59 ml/min   . CVA (cerebral infarction)   . Heart attack (HCC)   . Hyperlipidemia   . Hypertension     PAST SURGICAL HISTORY:   Past Surgical History:  Procedure Laterality Date  . CAROTID ENDARTERECTOMY    . CORONARY ANGIOPLASTY WITH STENT PLACEMENT      SOCIAL HISTORY:   Social History  Substance Use Topics  . Smoking status: Current Every Day Smoker  . Smokeless tobacco: Never Used  . Alcohol use No    FAMILY HISTORY:   Family History  Problem Relation Age of Onset  . Heart disease Mother   . Pleurisy Father   . Heart disease Sister     DRUG ALLERGIES:   Allergies  Allergen Reactions  . Oxycodone-Acetaminophen Nausea Only    Vomiting.  . Simvastatin     Other reaction(s): Other (See Comments) Questionable.    MEDICATIONS AT HOME:   Prior to Admission medications   Medication Sig Start Date End Date Taking? Authorizing Provider  clopidogrel (PLAVIX) 75 MG tablet Take 75 mg by mouth daily. 08/20/15  Yes [provider]  folic acid (FOLVITE) 1 MG tablet Take 1 mg by mouth daily. 10/20/15  Yes  [provider]  losartan (COZAAR) 50 MG tablet Take 50 mg by mouth daily.   Yes [provider]  lovastatin (MEVACOR) 40 MG tablet Take 40 mg by mouth at bedtime. 09/09/15  Yes [provider]  metoprolol (LOPRESSOR) 50 MG tablet Take 50 mg by mouth 2 (two) times daily. 09/09/15  Yes [provider]  tamsulosin (FLOMAX) 0.4 MG CAPS capsule Take 0.4 mg by mouth daily.   Yes [provider]    REVIEW OF SYSTEMS:  Review of Systems  Constitutional: Negative for chills, fever, malaise/fatigue and weight loss.  HENT: Negative for ear pain, hearing loss and tinnitus.   Eyes: Negative for blurred vision, double vision, pain and redness.  Respiratory: Negative for cough, hemoptysis and shortness of breath.   Cardiovascular: Positive for chest pain. Negative for palpitations, orthopnea and leg swelling.  Gastrointestinal: Positive for nausea. Negative for abdominal pain, constipation, diarrhea and vomiting.  Genitourinary: Negative for dysuria, frequency and hematuria.  Musculoskeletal: Negative for back pain, joint pain and neck pain.  Skin:       No acne, rash, or lesions  Neurological: Negative for dizziness, tremors, focal weakness and weakness.  Endo/Heme/Allergies: Negative for polydipsia. Does not bruise/bleed easily.  Psychiatric/Behavioral: Negative for depression. The patient is not nervous/anxious and does not have insomnia.      VITAL SIGNS:   Vitals:   08/16/17 2142 08/16/17 2144 08/16/17 2146  08/16/17 2311  BP:    (!) 151/49  Pulse: (!) 44 (!) 55 (!) 43 90  Resp: (!) 23 (!) 30 (!) 26 18  Temp:      TempSrc:      SpO2: 97% 98% 98% 96%  Weight:      Height:       Wt Readings from Last 3 Encounters:  08/16/17 68 kg (150 lb)  10/27/15 68.1 kg (150 lb 1.6 oz)    PHYSICAL EXAMINATION:  Physical Exam  Vitals reviewed. Constitutional: He is oriented to person, place, and time. He appears well-developed and well-nourished. No  distress.  HENT:  Head: Normocephalic and atraumatic.  Mouth/Throat: Oropharynx is clear and moist.  Eyes: Pupils are equal, round, and reactive to light. Conjunctivae and EOM are normal. No scleral icterus.  Neck: Normal range of motion. Neck supple. No JVD present. No thyromegaly present.  Cardiovascular: Normal rate, regular rhythm and intact distal pulses.  Exam reveals no gallop and no friction rub.   No murmur heard. Respiratory: Effort normal and breath sounds normal. No respiratory distress. He has no wheezes. He has no rales.  GI: Soft. Bowel sounds are normal. He exhibits no distension. There is no tenderness.  Musculoskeletal: Normal range of motion. He exhibits no edema.  No arthritis, no gout  Lymphadenopathy:    He has no cervical adenopathy.  Neurological: He is alert and oriented to person, place, and time. No cranial nerve deficit.  No dysarthria, no aphasia  Skin: Skin is warm and dry. No rash noted. No erythema.  Psychiatric: He has a normal mood and affect. His behavior is normal. Judgment and thought content normal.    LABORATORY PANEL:   CBC  Recent Labs Lab 08/16/17 2032  WBC 13.4*  HGB 14.1  HCT 41.3  PLT 232   ------------------------------------------------------------------------------------------------------------------  Chemistries   Recent Labs Lab 08/16/17 2032  NA 134*  K 3.7  CL 103  CO2 21*  GLUCOSE 201*  BUN 18  CREATININE 1.64*  CALCIUM 8.3*   ------------------------------------------------------------------------------------------------------------------  Cardiac Enzymes  Recent Labs Lab 08/16/17 2032  TROPONINI <0.03   ------------------------------------------------------------------------------------------------------------------  RADIOLOGY:  Dg Chest 2 View  Result Date: 08/16/2017 CLINICAL DATA:  Generalized fatigue and weakness x1 week EXAM: CHEST  2 VIEW COMPARISON:  10/25/2015 FINDINGS: The heart size and  mediastinal contours are within normal limits. Aortic atherosclerosis at the arch without aneurysm. Both lungs are clear. Osteoarthritic spurring at the [facility] joints bilaterally. No acute nor suspicious osseous abnormality. IMPRESSION: 1. No active cardiopulmonary disease. 2. Aortic atherosclerosis. Electronically Signed   By: Tollie Ethavid  Kwon M.D.   On: 08/16/2017 22:14    EKG:   Orders placed or performed during the hospital encounter of 08/16/17  . ED EKG  . ED EKG    IMPRESSION AND PLAN:  Principal Problem:   Chest pain - initial workup so far in the ED is within normal limits, however he does have some EKG changes including new left anterior fascicular block. He also had a short run of V. Tach in the ED, and is currently in bigeminy. We will trend his cardiac enzymes, keep him on telemetry tonight, get an echocardiogram and cardiology consult Active Problems:   HTN (hypertension) - continue home meds   HLD (hyperlipidemia) - home meds   CKD (chronic kidney disease), stage III - stable, avoid nephrotoxins and monitor  All the records are reviewed and case discussed with ED provider. Management plans discussed with the patient and/or family.  DVT PROPHYLAXIS: SubQ lovenox  GI PROPHYLAXIS: None  ADMISSION STATUS: Observation  CODE STATUS: Full Code Status History    Date Active Date Inactive Code Status Order ID Comments User Context   10/25/2015  3:54 PM 10/27/2015  6:08 PM Full Code [id number]  Marguarite Arbour, MD Inpatient      TOTAL TIME TAKING CARE OF THIS PATIENT: 40 minutes.   Anne Hahn, Quintavia Rogstad FIELDING 08/16/2017, 11:12 PM  [employer]  [phone]  CC: Primary care physician; Marisue Ivan, MD  Note:  This document was prepared using Dragon voice recognition software and may include unintentional dictation errors.

## 2017-08-16 NOTE — ED Notes (Signed)
Pt reports 35% Kidney function

## 2017-08-16 NOTE — ED Notes (Signed)
Pt reports weakness and dizziness that started "last week" - denies N/V - reports headaches - denies urinary freq or pain - c/o chest pain last week for several days (relieved with Tylenol) - he reports the chest pain as sharp pains off and on - denies shortness of breath

## 2017-08-16 NOTE — ED Triage Notes (Signed)
Pt c/o general fatigue and weakness x1 week, worsening today. Pt reports having intermittent chest pain as well but denies N/V and SOB. Pt has HX of MI and stroke, total of 7 stents. Pt has previous left sided weakness from past stroke, pt has no new deficits on assessment in triage.

## 2017-08-16 NOTE — ED Provider Notes (Signed)
[facility], [facility] Emergency Department Provider Note  ____________________________________________  Time seen: Approximately 9:49 PM  I have reviewed the triage vital signs and the nursing notes.   HISTORY  Chief Complaint Weakness    HPI Ane PaymentDonald D Burby Sr. is a 83 y.o. male with a history of CAD status post MI 2, CVA x 4, HTN, HL, and ongoing tobacco abuse presenting with chest pain, generalized weakness. The patient reports that around 2:30 this afternoon he was sitting at home when he developed a sharp central chest pain that radiated into the left upper extremity associated with generalized weakness and the inability to stand up. He had some mild associated shortness of breath, lightheadedness but no syncope, and denies palpitations. He took several Tylenol, which resolved his symptoms, but he was still unable to stand up on his own.He reports a similar episode that occurred 2 or 3 days ago while at rest. He does not take aspirin but does take Plavix daily. He denies any recent cough or cold symptoms, fever or chills, nausea vomiting or diarrhea. At this time, the patient is chest pain-free.   Past Medical History:  Diagnosis Date  . CKD (chronic kidney disease) stage 3, GFR 30-59 ml/min   . CVA (cerebral infarction)   . Heart attack (HCC)   . Hyperlipidemia   . Hypertension     Patient Active Problem List   Diagnosis Date Noted  . Chest pain 10/25/2015    History reviewed. No pertinent surgical history.  Current Outpatient Rx  . Order #: [id number] Class: Historical Med  . Order #: [id number] Class: Historical Med  . Order #: [id number] Class: Historical Med  . Order #: [id number] Class: Historical Med  . Order #: [id number] Class: Historical Med  . Order #: [id number] Class: Historical Med    Allergies Oxycodone-acetaminophen and Simvastatin  History reviewed. No pertinent family history.  Social History Social History  Substance Use Topics  .  Smoking status: Current Every Day Smoker  . Smokeless tobacco: Never Used  . Alcohol use No    Review of Systems Constitutional: No fever/chills.Positive generalized weakness.Positive lightheadedness without syncope. Negative diaphoresis. Eyes: No visual changes. ENT: No sore throat. No congestion or rhinorrhea. Cardiovascular: Positive chest pain. Denies palpitations. Respiratory: Positive shortness of breath.  No cough. Gastrointestinal: No abdominal pain.  No nausea, no vomiting.  No diarrhea.  No constipation. Genitourinary: Negative for dysuria. Musculoskeletal: Negative for back pain. Skin: Negative for rash. Neurological: Negative for headaches. No focal numbness, tingling or weakness.     ____________________________________________   PHYSICAL EXAM:  VITAL SIGNS: ED Triage Vitals  Enc Vitals Group     BP 08/16/17 2036 135/72     Pulse Rate 08/16/17 2036 97     Resp 08/16/17 2036 18     Temp 08/16/17 2036 98.1 F (36.7 C)     Temp Source 08/16/17 2036 Oral     SpO2 08/16/17 2036 99 %     Weight 08/16/17 2031 150 lb (68 kg)     Height 08/16/17 2031 5\' 6"  (1.676 m)     Head Circumference --      Peak Flow --      Pain Score 08/16/17 2130 0     Pain Loc --      Pain Edu? --      Excl. in GC? --     Constitutional: Alert and oriented. Well appearing and in no acute distress. Answers questions appropriately. Eyes: Conjunctivae are normal.  EOMI. No scleral icterus. Head: Atraumatic. Nose:  No congestion/rhinnorhea. Mouth/Throat: Mucous membranes are moist.  Neck: No stridor.  Supple.  Is a JVD. No meningismus. Cardiovascular: Normal rate, regular rhythm. No murmurs, rubs or gallops.  Respiratory: Normal respiratory effort.  No accessory muscle use or retractions. Lungs CTAB. And expiratory wheezing bilaterally. No rales or rhonchi. Gastrointestinal: Soft, nontender and nondistended.  No guarding or rebound.  No peritoneal signs. Musculoskeletal: No LE edema. No  ttp in the calves or palpable cords.  Negative Homan's sign. Neurologic:  A&Ox3.  Speech is clear.  Face and smile are symmetric.  EOMI.  Moves all extremities well. Skin:  Skin is warm, dry and intact. No rash noted. Psychiatric: Mood and affect are normal. Speech and behavior are normal.  Normal judgement.  ____________________________________________   LABS (all labs ordered are listed, but only abnormal results are displayed)  Labs Reviewed  BASIC METABOLIC PANEL - Abnormal; Notable for the following:       Result Value   Sodium 134 (*)    CO2 21 (*)    Glucose, Bld 201 (*)    Creatinine, Ser 1.64 (*)    Calcium 8.3 (*)    GFR calc non Af Amer 38 (*)    GFR calc Af Amer 44 (*)    All other components within normal limits  CBC - Abnormal; Notable for the following:    WBC 13.4 (*)    RBC 4.31 (*)    All other components within normal limits  URINALYSIS, COMPLETE (UACMP) WITH MICROSCOPIC - Abnormal; Notable for the following:    Color, Urine YELLOW (*)    APPearance CLEAR (*)    Hgb urine dipstick MODERATE (*)    Protein, ur 30 (*)    Squamous Epithelial / LPF 0-5 (*)    All other components within normal limits  TROPONIN I  CBG MONITORING, ED   ____________________________________________  EKG  ED ECG REPORT I, Rockne Menghini, the attending physician, personally viewed and interpreted this ECG.   Date: 08/16/2017  EKG Time: 2033  Rate: 93  Rhythm: normal sinus rhythm  Axis: leftward  Intervals:none  ST&T Change: Frequent PVCs. No STEMI.  ____________________________________________  RADIOLOGY  No results found.  ____________________________________________   PROCEDURES  Procedure(s) performed: None  Procedures  Critical Care performed: No ____________________________________________   INITIAL IMPRESSION / ASSESSMENT AND PLAN / ED COURSE  Pertinent labs & imaging results that were available during my care of the patient were reviewed by  me and considered in my medical decision making (see chart for details).  83 y.o. male with a history of CAD, CVA, ongoing tobacco abuse presenting with chest pain associated with lightheadedness and shortness of breath, and generalized weakness. Overall, the patient is hemodynamically stable but I am concerned about ACS or MI. Intermittent arrhythmias also possible and since the patient has arrived in the emergency department, he does have 1 run of 6 beats of V. tach that was asymptomatic and nonsustained. The patient's EKG shows multiple PVCs but no ischemic changes; his troponin is negative. I will treat him with aspirin, and plan to admit him to the hospital for further evaluation and treatment.  ____________________________________________  FINAL CLINICAL IMPRESSION(S) / ED DIAGNOSES  Final diagnoses:  Chest pain, unspecified type  Altered mental status, unspecified altered mental status type  Generalized weakness         NEW MEDICATIONS STARTED DURING THIS VISIT:  New Prescriptions   No medications on file      Rockne Menghini, MD 08/16/17 2159

## 2017-08-17 ENCOUNTER — Observation Stay
Admit: 2017-08-17 | Discharge: 2017-08-17 | Disposition: A | Discharge status: Home / Self Care | Payer: Medicaid Other | Attending: Internal Medicine | Admitting: Internal Medicine

## 2017-08-17 ENCOUNTER — Encounter: Payer: Self-pay | Admitting: *Deleted

## 2017-08-17 ENCOUNTER — Observation Stay: Payer: Medicaid Other

## 2017-08-17 LAB — NM MYOCAR MULTI W/SPECT W/WALL MOTION / EF
CHL CUP MPHR: 139 {beats}/min
CHL CUP NUCLEAR SDS: 2
CHL CUP NUCLEAR SSS: 3
CHL CUP RESTING HR STRESS: 69 {beats}/min
CSEPED: 1 min
CSEPEW: 1 METS
CSEPHR: 70 %
CSEPPHR: 98 {beats}/min
Exercise duration (sec): 0 s
LV dias vol: 59 mL (ref 62–150)
LV sys vol: 17 mL
SRS: 3
TID: 1.16

## 2017-08-17 LAB — TROPONIN I: Troponin I: <0.03 ng/mL (ref <0.03)

## 2017-08-17 LAB — BASIC METABOLIC PANEL
Anion gap: 8 (ref 5–15)
BUN: 19 mg/dL (ref 6–20)
CHLORIDE: 108 mmol/L (ref 101–111)
CO2: 22 mmol/L (ref 22–32)
CREATININE: 1.59 mg/dL — AB (ref 0.61–1.24)
Calcium: 8.1 mg/dL — ABNORMAL LOW (ref 8.9–10.3)
GFR calc non Af Amer: 39 mL/min — ABNORMAL LOW (ref >60)
GFR, EST AFRICAN AMERICAN: 45 mL/min — AB (ref >60)
Glucose, Bld: 121 mg/dL — ABNORMAL HIGH (ref 65–99)
Potassium: 3.8 mmol/L (ref 3.5–5.1)
SODIUM: 138 mmol/L (ref 135–145)

## 2017-08-17 LAB — ECHOCARDIOGRAM COMPLETE
Height: 66 in
Weight: 2531.2 oz

## 2017-08-17 LAB — CBC
HEMATOCRIT: 39.6 % — AB (ref 40.0–52.0)
Hemoglobin: 13.8 g/dL (ref 13.0–18.0)
MCH: 32.9 pg (ref 26.0–34.0)
MCHC: 34.8 g/dL (ref 32.0–36.0)
MCV: 94.5 fL (ref 80.0–100.0)
Platelets: 192 10*3/uL (ref 150–440)
RBC: 4.19 MIL/uL — ABNORMAL LOW (ref 4.40–5.90)
RDW: 14 % (ref 11.5–14.5)
WBC: 12.1 10*3/uL — AB (ref 3.8–10.6)

## 2017-08-17 MED ORDER — ACETAMINOPHEN 325 MG PO TABS: 650.0000 mg | ORAL_TABLET | Freq: Four times a day (QID) | ORAL | Status: DC | PRN

## 2017-08-17 MED ORDER — ENOXAPARIN SODIUM 40 MG/0.4ML ~~LOC~~ SOLN: 40.0000 mg | SUBCUTANEOUS | Status: DC

## 2017-08-17 MED ORDER — ENOXAPARIN SODIUM 30 MG/0.3ML ~~LOC~~ SOLN
30.0000 mg | SUBCUTANEOUS | Status: DC
  Administered 2017-08-17: 30 mg via SUBCUTANEOUS
  Filled 2017-08-17: qty 0.3

## 2017-08-17 MED ORDER — ONDANSETRON HCL 4 MG PO TABS: 4.0000 mg | ORAL_TABLET | Freq: Four times a day (QID) | ORAL | Status: DC | PRN

## 2017-08-17 MED ORDER — TECHNETIUM TC 99M TETROFOSMIN IV KIT
13.0000 | PACK | Freq: Once | INTRAVENOUS | Status: AC | PRN
  Administered 2017-08-17: 12.831 via INTRAVENOUS

## 2017-08-17 MED ORDER — TECHNETIUM TC 99M TETROFOSMIN IV KIT
30.0000 | PACK | Freq: Once | INTRAVENOUS | Status: AC | PRN
  Administered 2017-08-17: 27.524 via INTRAVENOUS

## 2017-08-17 MED ORDER — ONDANSETRON HCL 4 MG/2ML IJ SOLN: 4.0000 mg | Freq: Four times a day (QID) | INTRAMUSCULAR | Status: DC | PRN

## 2017-08-17 MED ORDER — SODIUM CHLORIDE 0.9 % IV BOLUS (SEPSIS)
500.0000 mL | Freq: Once | INTRAVENOUS | Status: AC
  Administered 2017-08-17: 500 mL via INTRAVENOUS

## 2017-08-17 MED ORDER — METOPROLOL TARTRATE 50 MG PO TABS
50.0000 mg | ORAL_TABLET | Freq: Two times a day (BID) | ORAL | Status: DC
  Filled 2017-08-17: qty 1

## 2017-08-17 MED ORDER — ACETAMINOPHEN 650 MG RE SUPP: 650.0000 mg | Freq: Four times a day (QID) | RECTAL | Status: DC | PRN

## 2017-08-17 MED ORDER — PRAVASTATIN SODIUM 40 MG PO TABS: 40.0000 mg | ORAL_TABLET | Freq: Every day | ORAL | Status: DC

## 2017-08-17 MED ORDER — CLOPIDOGREL BISULFATE 75 MG PO TABS
75.0000 mg | ORAL_TABLET | Freq: Every day | ORAL | Status: DC
  Administered 2017-08-17: 75 mg via ORAL
  Filled 2017-08-17: qty 1

## 2017-08-17 MED ORDER — LOSARTAN POTASSIUM 50 MG PO TABS
50.0000 mg | ORAL_TABLET | Freq: Every day | ORAL | Status: DC
  Administered 2017-08-17: 50 mg via ORAL
  Filled 2017-08-17: qty 1

## 2017-08-17 MED ORDER — TAMSULOSIN HCL 0.4 MG PO CAPS
0.4000 mg | ORAL_CAPSULE | Freq: Every day | ORAL | Status: DC
  Administered 2017-08-17: 0.4 mg via ORAL
  Filled 2017-08-17: qty 1

## 2017-08-17 NOTE — Care Management (Signed)
Patient placed in observation for chest pain. Spoke with patient's wife.  There is no insurance listed in patient's record.  Wife says patient has medicare A but did not sign up for part B "five years ago when we should have" and now part B coverage would be 300 dollars  a month.  [facility] and he has assistance with that clinic with paying for MD appointments. Has medicare d coverage.  Spoke with Fleet ContrasRachel in financial departmart about lack of medicare b coverage for this outpatient stay and she will meet with patient and wife.  Wife is very Adult nurseappreciative

## 2017-08-17 NOTE — Progress Notes (Signed)
Pt discharging to home per MD order. Reviewed orthostatic VS with Dr. Elpidio AnisSudini. Pt is asymptomatic. No changes to medications at this time. Reviewed discharge paperwork with patient and wife. Instructions for follow up appointment given. Stressed smoking cessation as well. IV & tele removed. Pt wheeled to personal vehicle by nurse tech.

## 2017-08-17 NOTE — Progress Notes (Signed)
Pt back from stress test and echo completed at bedside. Orthostatic VS assessed and were positive. 1000 medications given. Diet ordered for pt and lunch brought to room by dietary. Pt offers no complaints at this time. Will continue to monitor.

## 2017-08-17 NOTE — Progress Notes (Signed)
*  PRELIMINARY RESULTS* Echocardiogram 2D Echocardiogram has been performed.  Cristela BlueHege, Pauletta Pickney 08/17/2017, 2:54 PM

## 2017-08-17 NOTE — Progress Notes (Signed)
Order for enoxaparin 30 mg subcutaneously daily was changed to enoxaparin 40 mg dose per anticoagulation protocol for CrCl > 30 mL/min.  Cindi CarbonMary M Mamoru Takeshita, PharmD 08/17/17 10:09 AM

## 2017-08-17 NOTE — Care Management Obs Status (Signed)
MEDICARE OBSERVATION STATUS NOTIFICATION   Patient Details  Name: Lonnie ClockDonald D Sear Sr. MRN: [id number] Date of Birth: 01/23/1934   Medicare Observation Status Notification Given:  Yes Notice signed by wife., one given to patient and the other to HIM for scanning.  Patient does not have medicare b    Eber HongGreene, Patrich Heinze R, RN 08/17/2017, 11:54 AM

## 2017-08-17 NOTE — Consult Note (Signed)
[facility] Cardiology  CARDIOLOGY CONSULT NOTE  Patient ID: Lonnie Clock Sr. MRN: [id number] DOB/AGE: [birth year] 83 y.o.  Admit date: 08/16/2017 Referring Physician Sudini Primary Physician Linthavong Primary Cardiologist  Reason for Consultation Chest pain  HPI: 83 year old gentleman referred for evaluation chest pain. The patient has known coronary disease, status post multiple coronary stents 05/21/2003 at [facility]. The patient presents with chief complaint with weakness in his legs. He does report intermittent episodes of chest discomfort, substernal location, with occasional radiation to his left arm. EKG revealed sinus rhythm with frequent PVCs. Telemetry revealed 16 beat run of about complex tachycardia. Patient denies presyncope or syncope. She'll labs were notable for negative troponin less than 0.03. Recent 2-D echocardiogram 10/26/2015 revealed normal left ventricular function, with LVEF 50-55%.  Review of systems complete and found to be negative unless listed above     Past Medical History:  Diagnosis Date  . CKD (chronic kidney disease) stage 3, GFR 30-59 ml/min   . CVA (cerebral infarction)   . Heart attack (HCC)   . Hyperlipidemia   . Hypertension     Past Surgical History:  Procedure Laterality Date  . CAROTID ENDARTERECTOMY    . CORONARY ANGIOPLASTY WITH STENT PLACEMENT      Prescriptions Prior to Admission  Medication Sig Dispense Refill Last Dose  . clopidogrel (PLAVIX) 75 MG tablet Take 75 mg by mouth daily.  1 08/16/2017 at Unknown time  . folic acid (FOLVITE) 1 MG tablet Take 1 mg by mouth daily.  1 08/16/2017 at Unknown time  . losartan (COZAAR) 50 MG tablet Take 50 mg by mouth daily.   08/16/2017 at Unknown time  . lovastatin (MEVACOR) 40 MG tablet Take 40 mg by mouth at bedtime.   08/16/2017 at Unknown time  . metoprolol (LOPRESSOR) 50 MG tablet Take 50 mg by mouth 2 (two) times daily.   08/16/2017 at Unknown time  . tamsulosin (FLOMAX) 0.4 MG CAPS capsule Take 0.4 mg  by mouth daily.   08/16/2017 at Unknown time   Social History   Social History  . Marital status: Married    Spouse name: N/A  . Number of children: N/A  . Years of education: N/A   Occupational History  . Not on file.   Social History Main Topics  . Smoking status: Current Every Day Smoker  . Smokeless tobacco: Never Used  . Alcohol use No  . Drug use: No  . Sexual activity: Not on file   Other Topics Concern  . Not on file   Social History Narrative  . No narrative on file    Family History  Problem Relation Age of Onset  . Heart disease Mother   . Pleurisy Father   . Heart disease Sister       Review of systems complete and found to be negative unless listed above      PHYSICAL EXAM  General: Well developed, well nourished, in no acute distress HEENT:  Normocephalic and atramatic Neck:  No JVD.  Lungs: Clear bilaterally to auscultation and percussion. Heart: HRRR . Normal S1 and S2 without gallops or murmurs.  Abdomen: Bowel sounds are positive, abdomen soft and non-tender  Msk:  Back normal, normal gait. Normal strength and tone for age. Extremities: No clubbing, cyanosis or edema.   Neuro: Alert and oriented X 3. Psych:  Good affect, responds appropriately  Labs:   Lab Results  Component Value Date   WBC 12.1 (H) 08/17/2017   HGB 13.8 08/17/2017  HCT 39.6 (L) 08/17/2017   MCV 94.5 08/17/2017   PLT 192 08/17/2017    Recent Labs Lab 08/17/17 0238  NA 138  K 3.8  CL 108  CO2 22  BUN 19  CREATININE 1.59*  CALCIUM 8.1*  GLUCOSE 121*   Lab Results  Component Value Date   CKTOTAL 203 07/12/2012   CKMB 1.2 07/12/2012   TROPONINI <0.03 08/17/2017    Lab Results  Component Value Date   CHOL 98 07/12/2012   Lab Results  Component Value Date   HDL 31 (L) 07/12/2012   Lab Results  Component Value Date   LDLCALC 39 07/12/2012   Lab Results  Component Value Date   TRIG 142 07/12/2012   No results found for: CHOLHDL No results  found for: LDLDIRECT    Radiology: Dg Chest 2 View  Result Date: 08/16/2017 CLINICAL DATA:  Generalized fatigue and weakness x1 week EXAM: CHEST  2 VIEW COMPARISON:  10/25/2015 FINDINGS: The heart size and mediastinal contours are within normal limits. Aortic atherosclerosis at the arch without aneurysm. Both lungs are clear. [facility]ritic spur[facility] bilaterally. No acute nor suspicious osseous abnormality. IMPRESSION: 1. No active cardiopulmonary disease. 2. Aortic atherosclerosis. Electronically Signed   By: Tollie Ethavid  Kwon M.D.   On: 08/16/2017 22:14    EKG: Sinus rhythm with frequent PVCs  ASSESSMENT AND PLAN:   1. Chest pain, with typical and atypical features, negative troponin 2. Known CAD, status post Cypher stents mid and distal LAD, proximal RCA 05/21/2003 3. Frequent ventricular ectopy, ventricular bigeminy, 6 beat run about complex tachycardia, of uncertain clinical significance, no history of presyncope or syncope  Recommendations  1. Agree with current therapy 2. Defer full dose anticoagulation 3. Lexiscan Myoview 4. Review 2-D echocardiogram   Signed: Marcina MillardAlexander Menucha Dicesare MD,PhD, [facility] 08/17/2017, 9:11 AM

## 2017-08-20 NOTE — Discharge Summary (Signed)
[facility] - [place] [facility]   PATIENT NAME: Lonnie [name]    MR#:  [id number]  DATE OF BIRTH:  [birth year]  DATE OF ADMISSION:  08/16/2017 ADMITTING PHYSICIAN: Oralia Manis, MD  DATE OF DISCHARGE: 08/17/2017  5:51 PM  PRIMARY CARE PHYSICIAN: Marisue Ivan, MD   ADMISSION DIAGNOSIS:  Wheezing [R06.2] Generalized weakness [R53.1] Altered mental status, unspecified altered mental status type [R41.82] Chest pain, unspecified type [R07.9]  DISCHARGE DIAGNOSIS:  Principal Problem:   Chest pain Active Problems:   HLD (hyperlipidemia)   HTN (hypertension)   CKD (chronic kidney disease), stage III   SECONDARY DIAGNOSIS:   Past Medical History:  Diagnosis Date  . CHF (congestive heart failure) (HCC)    Per patient questionaire  . CKD (chronic kidney disease) stage 3, GFR 30-59 ml/min   . CVA (cerebral infarction)   . Heart attack (HCC)   . Hyperlipidemia   . Hypertension      ADMITTING HISTORY  Lonnie [name]  is a 83 y.o. male who presents with weakness, recurring chest pain. Patient states that for the past several days he's had these symptoms of chest pain with radiation to his left arm associated with nausea and diaphoresis. He has significant cardiac history including multiple stents in the past. Hospitals were called for admission and further evaluation   HOSPITAL COURSE:   * typical chest pain Patient was see by cardiology after admission to telemetry floor. Troponins were negative. EKG showed no acute changes. Cardiology recommended a stress test which was ordered. This was normal. Patient did not have any further chest pain in the hospital. Advise discharge home by cardiology. Patient is stable. Will follow-up with His cardiologist and primary care physician in one week  CONSULTS OBTAINED:  Treatment Team:  Marcina Millard, MD  DRUG ALLERGIES:   Allergies  Allergen Reactions  . Oxycodone-Acetaminophen Nausea Only   Vomiting.  . Simvastatin     Other reaction(s): Other (See Comments) Questionable.    DISCHARGE MEDICATIONS:   Discharge Medication List as of 08/17/2017  5:37 PM    CONTINUE these medications which have NOT CHANGED   Details  clopidogrel (PLAVIX) 75 MG tablet Take 75 mg by mouth daily., Starting 08/20/2015, Until Discontinued, Historical Med    folic acid (FOLVITE) 1 MG tablet Take 1 mg by mouth daily., Starting 10/20/2015, Until Discontinued, Historical Med    losartan (COZAAR) 50 MG tablet Take 50 mg by mouth daily., Historical Med    lovastatin (MEVACOR) 40 MG tablet Take 40 mg by mouth at bedtime., Starting 09/09/2015, Until Discontinued, Historical Med    metoprolol (LOPRESSOR) 50 MG tablet Take 50 mg by mouth 2 (two) times daily., Starting 09/09/2015, Until Discontinued, Historical Med    tamsulosin (FLOMAX) 0.4 MG CAPS capsule Take 0.4 mg by mouth daily., Historical Med        Today   VITAL SIGNS:  Blood pressure (!) 86/41, pulse (!) 50, temperature 97.8 F (36.6 C), temperature source Oral, resp. rate 18, height  (1.676 m), weight 71.8 kg (158 lb 3.2 oz), SpO2 98 %.  I/O:  No intake or output data in the 24 hours ending 08/20/17 1138  PHYSICAL EXAMINATION:  Physical Exam  GENERAL:  83 y.o.-year-old patient lying in the bed with no acute distress.  LUNGS: Normal breath sounds bilaterally, no wheezing, rales,rhonchi or crepitation. No use of accessory muscles of respiration.  CARDIOVASCULAR: S1, S2 normal. No murmurs, rubs, or gallops.  ABDOMEN: Soft, non-tender, non-distended. Bowel sounds present.  No organomegaly or mass.  NEUROLOGIC: Moves all 4 extremities. PSYCHIATRIC: The patient is alert and oriented x 3.  SKIN: No obvious rash, lesion, or ulcer.   DATA REVIEW:   CBC  Recent Labs Lab 08/17/17 0238  WBC 12.1*  HGB 13.8  HCT 39.6*  PLT 192    Chemistries   Recent Labs Lab 08/17/17 0238  NA 138  K 3.8  CL 108  CO2 22  GLUCOSE 121*  BUN  19  CREATININE 1.59*  CALCIUM 8.1*    Cardiac Enzymes  Recent Labs Lab 08/17/17 1431  TROPONINI <0.03    Microbiology Results  No results found for this or any previous visit.  RADIOLOGY:  No results found.  Follow up with PCP in 1 week.  Management plans discussed with the patient, family and they are in agreement.  CODE STATUS:  Code Status History    Date Active Date Inactive Code Status Order ID Comments User Context   08/17/2017 12:18 AM 08/17/2017  8:56 PM Full Code [id number]  Oralia Manis, MD Inpatient   10/25/2015  3:54 PM 10/27/2015  6:08 PM Full Code [id number]  Marguarite Arbour, MD Inpatient      TOTAL TIME TAKING CARE OF THIS PATIENT ON DAY OF DISCHARGE: more than 30 minutes.   Milagros Loll R M.D on 08/20/2017 at 11:38 AM  Between 7am to 6pm - Pager - [phone]  After 6pm go to [email or web address] - password [email or web address] [facility]  [facility] [place] [facility]  Office  [phone]  CC: Primary care physician; Marisue Ivan, MD  Note: This dictation was prepared with Dragon dictation along with smaller phrase technology. Any transcriptional errors that result from this process are unintentional.

## 2018-06-28 ENCOUNTER — Encounter: Payer: Self-pay | Admitting: Emergency Medicine

## 2018-06-28 ENCOUNTER — Emergency Department
Admission: EM | Admit: 2018-06-28 | Discharge: 2018-06-28 | Disposition: A | Discharge status: Home / Self Care | Payer: Self-pay | Attending: Emergency Medicine | Admitting: Emergency Medicine

## 2018-06-28 DIAGNOSIS — Y929 Unspecified place or not applicable: Secondary | ICD-10-CM | POA: Insufficient documentation

## 2018-06-28 DIAGNOSIS — Y9389 Activity, other specified: Secondary | ICD-10-CM | POA: Insufficient documentation

## 2018-06-28 DIAGNOSIS — I130 Hypertensive heart and chronic kidney disease with heart failure and stage 1 through stage 4 chronic kidney disease, or unspecified chronic kidney disease: Secondary | ICD-10-CM | POA: Insufficient documentation

## 2018-06-28 DIAGNOSIS — Z7902 Long term (current) use of antithrombotics/antiplatelets: Secondary | ICD-10-CM | POA: Insufficient documentation

## 2018-06-28 DIAGNOSIS — Z79899 Other long term (current) drug therapy: Secondary | ICD-10-CM | POA: Insufficient documentation

## 2018-06-28 DIAGNOSIS — N183 Chronic kidney disease, stage 3 (moderate): Secondary | ICD-10-CM | POA: Insufficient documentation

## 2018-06-28 DIAGNOSIS — W458XXA Other foreign body or object entering through skin, initial encounter: Secondary | ICD-10-CM | POA: Insufficient documentation

## 2018-06-28 DIAGNOSIS — F17200 Nicotine dependence, unspecified, uncomplicated: Secondary | ICD-10-CM | POA: Insufficient documentation

## 2018-06-28 DIAGNOSIS — S61216A Laceration without foreign body of right little finger without damage to nail, initial encounter: Secondary | ICD-10-CM | POA: Insufficient documentation

## 2018-06-28 DIAGNOSIS — Z23 Encounter for immunization: Secondary | ICD-10-CM | POA: Insufficient documentation

## 2018-06-28 DIAGNOSIS — I509 Heart failure, unspecified: Secondary | ICD-10-CM | POA: Insufficient documentation

## 2018-06-28 DIAGNOSIS — Y999 Unspecified external cause status: Secondary | ICD-10-CM | POA: Insufficient documentation

## 2018-06-28 MED ORDER — LIDOCAINE HCL (PF) 1 % IJ SOLN
5.0000 mL | Freq: Once | INTRAMUSCULAR | Status: AC
  Administered 2018-06-28: 5 mL

## 2018-06-28 MED ORDER — LIDOCAINE HCL (PF) 1 % IJ SOLN
INTRAMUSCULAR | Status: AC
  Filled 2018-06-28: qty 5

## 2018-06-28 MED ORDER — TETANUS-DIPHTH-ACELL PERTUSSIS 5-2.5-18.5 LF-MCG/0.5 IM SUSP
0.5000 mL | Freq: Once | INTRAMUSCULAR | Status: AC
  Administered 2018-06-28: 0.5 mL via INTRAMUSCULAR
  Filled 2018-06-28: qty 0.5

## 2018-06-28 NOTE — ED Triage Notes (Addendum)
Patient presents to the ED with a laceration to his 5th finger on his right hand.  Prior to triage, finger was steadily oozing blood.  This RN put pressure to wound for several minutes which still did not improve bleeding. Patient's wife states patient was pushing trash down in the trash can and possibly was cut by a can.  Patient is in no obvious distress at this time.

## 2018-06-28 NOTE — Discharge Instructions (Addendum)
Keep the wound clean, dry, and covered. See your provider in 10 days for suture removal.  °

## 2018-06-28 NOTE — ED Provider Notes (Signed)
[facility] Emergency Department Provider Note ____________________________________________  Time seen: 1635 upon arrival  I have reviewed the triage vital signs and the nursing notes.  HISTORY  Chief Complaint  Extremity Laceration  HPI Ane PaymentDonald D Hamil Sr. is a 84 y.o. male presents to the ED accompanied by his wife, for evaluation of an accidental laceration to the right pinky.  Patient was taking out the trash this afternoon, when he accidentally cut the middle digit of his right pinky finger.  He apparently pushed down the trash with his hand, and likely cut his finger on an open can of cat food.  Patient reports he is on Plavix daily, and continues to have active bleeding from the finger wound.  He also is unclear of his current tetanus status but he denies any other injury at this time.  Past Medical History:  Diagnosis Date  . CHF (congestive heart failure) (HCC)    Per patient questionaire  . CKD (chronic kidney disease) stage 3, GFR 30-59 ml/min (HCC)   . CVA (cerebral infarction)   . Heart attack (HCC)   . Hyperlipidemia   . Hypertension     Patient Active Problem List   Diagnosis Date Noted  . HLD (hyperlipidemia) 08/16/2017  . HTN (hypertension) 08/16/2017  . CKD (chronic kidney disease), stage III (HCC) 08/16/2017  . Chest pain 10/25/2015    Past Surgical History:  Procedure Laterality Date  . CAROTID ENDARTERECTOMY    . CORONARY ANGIOPLASTY WITH STENT PLACEMENT      Prior to Admission medications   Medication Sig Start Date End Date Taking? Authorizing Provider  clopidogrel (PLAVIX) 75 MG tablet Take 75 mg by mouth daily. 08/20/15   [provider]  folic acid (FOLVITE) 1 MG tablet Take 1 mg by mouth daily. 10/20/15   [provider]  losartan (COZAAR) 50 MG tablet Take 50 mg by mouth daily.    [provider]  lovastatin (MEVACOR) 40 MG tablet Take 40 mg by mouth at bedtime. 09/09/15   [provider]  metoprolol (LOPRESSOR) 50 MG tablet Take 50 mg by mouth 2 (two) times daily. 09/09/15   [provider]  tamsulosin (FLOMAX) 0.4 MG CAPS capsule Take 0.4 mg by mouth daily.    [provider]    Allergies Oxycodone-acetaminophen and Simvastatin  Family History  Problem Relation Age of Onset  . Heart disease Mother   . Pleurisy Father   . Heart disease Sister     Social History Social History   Tobacco Use  . Smoking status: Current Every Day Smoker  . Smokeless tobacco: Never Used  Substance Use Topics  . Alcohol use: No  . Drug use: No    Review of Systems  Constitutional: Negative for fever. Cardiovascular: Negative for chest pain. Respiratory: Negative for shortness of breath. Musculoskeletal: Negative for back pain. Skin: Negative for rash.  Right pinky laceration as above. Neurological: Negative for headaches, focal weakness or numbness. ____________________________________________  PHYSICAL EXAM:  VITAL SIGNS: ED Triage Vitals  Enc Vitals Group     BP 06/28/18 1624 (!) 198/80     Pulse Rate 06/28/18 1624 72     Resp 06/28/18 1624 18     Temp 06/28/18 1624 98.7 F (37.1 C)     Temp Source 06/28/18 1624 Oral     SpO2 06/28/18 1624 98 %     Weight 06/28/18 1625 157 lb (71.2 kg)     Height 06/28/18 1625 5\' 7"  (1.702 m)  Head Circumference --      Peak Flow --      Pain Score 06/28/18 1624 0     Pain Loc --      Pain Edu? --      Excl. in GC? --     Constitutional: Alert and oriented. Well appearing and in no distress. Head: Normocephalic and atraumatic. Eyes: Conjunctivae are normal.  Ears: Patient is hard-of-hearing Cardiovascular: Normal rate, regular rhythm. Normal distal pulses. Respiratory: Normal respiratory effort.  Musculoskeletal: Normal composite fist to the right hand.  Patient with a linear laceration transversing the palmar aspect of the PIP nontender with normal range of motion in all extremities.  Neurologic:   Normal gait without ataxia. Normal speech and language. No gross focal neurologic deficits are appreciated. Skin:  Skin is warm, dry and intact. No rash noted. Psychiatric: Mood and affect are normal. Patient exhibits appropriate insight and judgment. ____________________________________________  PROCEDURES  Tdap 0.5 ml IM  .Marland KitchenLaceration Repair Date/Time: 06/28/2018 6:14 PM Performed by: Jesse Sans, Student-PA Authorized by: Lissa Hoard, PA-C   Consent:    Consent obtained:  Verbal   Consent given by:  Patient   Risks discussed:  Poor wound healing Anesthesia (see MAR for exact dosages):    Anesthesia method: transthecal block. Laceration details:    Location:  Finger   Finger location:  R small finger   Length (cm):  1 Repair type:    Repair type:  Simple Pre-procedure details:    Preparation:  Patient was prepped and draped in usual sterile fashion Exploration:    Hemostasis achieved with:  Tourniquet Treatment:    Area cleansed with:  Betadine   Amount of cleaning:  Standard   Irrigation solution:  Sterile saline   Irrigation method:  Syringe Skin repair:    Repair method:  Sutures   Suture size:  4-0   Suture material:  Nylon   Suture technique:  Simple interrupted   Number of sutures:  3 Approximation:    Approximation:  Close Post-procedure details:    Dressing:  Non-adherent dressing   Patient tolerance of procedure:  Tolerated well, no immediate complications  ____________________________________________  INITIAL IMPRESSION / ASSESSMENT AND PLAN / ED COURSE  Patient with ED evaluation of an accidental laceration to the right pinky.  Patient's wound is repaired with sutures with good wound approximation and hemostasis.  The wound is dressed with a pressure dressing and wound care instructions as well as supplies are provided to the patient and his wife.  They will see the primary provider in 10 days for suture  removal. ____________________________________________  FINAL CLINICAL IMPRESSION(S) / ED DIAGNOSES  Final diagnoses:  Laceration of right little finger without foreign body without damage to nail, initial encounter      Lissa Hoard, PA-C 06/28/18 1816    Merrily Brittle, MD 06/29/18 1910

## 2018-10-02 ENCOUNTER — Encounter: Payer: Self-pay | Admitting: Emergency Medicine

## 2018-10-02 ENCOUNTER — Emergency Department
Admission: EM | Admit: 2018-10-02 | Discharge: 2018-10-02 | Disposition: A | Discharge status: Home / Self Care | Payer: Self-pay | Attending: Emergency Medicine | Admitting: Emergency Medicine

## 2018-10-02 DIAGNOSIS — Z955 Presence of coronary angioplasty implant and graft: Secondary | ICD-10-CM | POA: Insufficient documentation

## 2018-10-02 DIAGNOSIS — Z79899 Other long term (current) drug therapy: Secondary | ICD-10-CM | POA: Insufficient documentation

## 2018-10-02 DIAGNOSIS — F17200 Nicotine dependence, unspecified, uncomplicated: Secondary | ICD-10-CM | POA: Insufficient documentation

## 2018-10-02 DIAGNOSIS — Z8673 Personal history of transient ischemic attack (TIA), and cerebral infarction without residual deficits: Secondary | ICD-10-CM | POA: Insufficient documentation

## 2018-10-02 DIAGNOSIS — I252 Old myocardial infarction: Secondary | ICD-10-CM | POA: Insufficient documentation

## 2018-10-02 DIAGNOSIS — I10 Essential (primary) hypertension: Secondary | ICD-10-CM

## 2018-10-02 DIAGNOSIS — N183 Chronic kidney disease, stage 3 (moderate): Secondary | ICD-10-CM | POA: Insufficient documentation

## 2018-10-02 DIAGNOSIS — Z7902 Long term (current) use of antithrombotics/antiplatelets: Secondary | ICD-10-CM | POA: Insufficient documentation

## 2018-10-02 DIAGNOSIS — I509 Heart failure, unspecified: Secondary | ICD-10-CM | POA: Insufficient documentation

## 2018-10-02 DIAGNOSIS — I130 Hypertensive heart and chronic kidney disease with heart failure and stage 1 through stage 4 chronic kidney disease, or unspecified chronic kidney disease: Secondary | ICD-10-CM | POA: Insufficient documentation

## 2018-10-02 LAB — BASIC METABOLIC PANEL
ANION GAP: 9 (ref 5–15)
BUN: 19 mg/dL (ref 8–23)
CALCIUM: 9.1 mg/dL (ref 8.9–10.3)
CO2: 23 mmol/L (ref 22–32)
CREATININE: 1.5 mg/dL — AB (ref 0.61–1.24)
Chloride: 106 mmol/L (ref 98–111)
GFR, EST AFRICAN AMERICAN: 48 mL/min — AB (ref >60)
GFR, EST NON AFRICAN AMERICAN: 42 mL/min — AB (ref >60)
Glucose, Bld: 101 mg/dL — ABNORMAL HIGH (ref 70–99)
Potassium: 3.7 mmol/L (ref 3.5–5.1)
SODIUM: 138 mmol/L (ref 135–145)

## 2018-10-02 LAB — URINALYSIS, COMPLETE (UACMP) WITH MICROSCOPIC
BILIRUBIN URINE: NEGATIVE
Bacteria, UA: NONE SEEN
GLUCOSE, UA: NEGATIVE mg/dL
Ketones, ur: NEGATIVE mg/dL
LEUKOCYTES UA: NEGATIVE
NITRITE: NEGATIVE
PH: 5 (ref 5.0–8.0)
Protein, ur: NEGATIVE mg/dL
SQUAMOUS EPITHELIAL / LPF: NONE SEEN (ref 0–5)
Specific Gravity, Urine: 1.005 (ref 1.005–1.030)

## 2018-10-02 LAB — CBC
HCT: 44 % (ref 39.0–52.0)
Hemoglobin: 14.2 g/dL (ref 13.0–17.0)
MCH: 31.8 pg (ref 26.0–34.0)
MCHC: 32.3 g/dL (ref 30.0–36.0)
MCV: 98.7 fL (ref 80.0–100.0)
NRBC: 0 % (ref 0.0–0.2)
PLATELETS: 272 10*3/uL (ref 150–400)
RBC: 4.46 MIL/uL (ref 4.22–5.81)
RDW: 13.2 % (ref 11.5–15.5)
WBC: 10.4 10*3/uL (ref 4.0–10.5)

## 2018-10-02 LAB — TROPONIN I: Troponin I: <0.03 ng/mL (ref <0.03)

## 2018-10-02 MED ORDER — AMLODIPINE BESYLATE 5 MG PO TABS
2.5000 mg | ORAL_TABLET | Freq: Once | ORAL | Status: AC
  Administered 2018-10-02: 2.5 mg via ORAL
  Filled 2018-10-02: qty 1

## 2018-10-02 NOTE — ED Provider Notes (Signed)
[facility] Emergency Department Provider Note  ____________________________________________   I have reviewed the triage vital signs and the nursing notes.   HISTORY  Chief Complaint Hypertension   History limited by: Not Limited   HPI Lonnie [name]. is a 84 y.o. male who presents to the emergency department today because of concerns for high blood pressure.  Patient went to his primary care doctor's office today where is noted he had high blood pressure.  Doctor advised him to increase his blood pressure dose.  He did that however when they continue to check blood pressure at home his systolic continue to be elevated.  Patient denies any headaches or chest pain.  When they called back to the doctor's office the nurse on the line and advised him to present to the emergency department for further evaluation.   Per medical record review patient has a history of CHF, CKD, CVA, heart attack, hld, htn.   Past Medical History:  Diagnosis Date  . CHF (congestive heart failure) (HCC)    Per patient questionaire  . CKD (chronic kidney disease) stage 3, GFR 30-59 ml/min (HCC)   . CVA (cerebral infarction)   . Heart attack (HCC)   . Hyperlipidemia   . Hypertension     Patient Active Problem List   Diagnosis Date Noted  . HLD (hyperlipidemia) 08/16/2017  . HTN (hypertension) 08/16/2017  . CKD (chronic kidney disease), stage III (HCC) 08/16/2017  . Chest pain 10/25/2015    Past Surgical History:  Procedure Laterality Date  . CAROTID ENDARTERECTOMY    . CORONARY ANGIOPLASTY WITH STENT PLACEMENT      Prior to Admission medications   Medication Sig Start Date End Date Taking? Authorizing Provider  clopidogrel (PLAVIX) 75 MG tablet Take 75 mg by mouth daily. 08/20/15   [provider]  folic acid (FOLVITE) 1 MG tablet Take 1 mg by mouth daily. 10/20/15   [provider]  losartan (COZAAR) 50 MG tablet Take 50 mg by mouth daily.     [provider]  lovastatin (MEVACOR) 40 MG tablet Take 40 mg by mouth at bedtime. 09/09/15   [provider]  metoprolol (LOPRESSOR) 50 MG tablet Take 50 mg by mouth 2 (two) times daily. 09/09/15   [provider]  tamsulosin (FLOMAX) 0.4 MG CAPS capsule Take 0.4 mg by mouth daily.    [provider]    Allergies Oxycodone-acetaminophen and Simvastatin  Family History  Problem Relation Age of Onset  . Heart disease Mother   . Pleurisy Father   . Heart disease Sister     Social History Social History   Tobacco Use  . Smoking status: Current Every Day Smoker  . Smokeless tobacco: Never Used  Substance Use Topics  . Alcohol use: No  . Drug use: No    Review of Systems Constitutional: No fever/chills Eyes: No visual changes. ENT: No sore throat. Cardiovascular: Denies chest pain. Respiratory: Denies shortness of breath. Gastrointestinal: No abdominal pain.  No nausea, no vomiting.  No diarrhea.   Genitourinary: Negative for dysuria. Musculoskeletal: Negative for back pain. Skin: Negative for rash. Neurological: Negative for headaches, focal weakness or numbness.  ____________________________________________   PHYSICAL EXAM:  VITAL SIGNS: ED Triage Vitals  Enc Vitals Group     BP 10/02/18 1751 (!) 219/78     Pulse Rate 10/02/18 1751 (!) 42     Resp 10/02/18 1751 18     Temp 10/02/18 1751 99.1 F (37.3 C)  Temp Source 10/02/18 1751 Oral     SpO2 10/02/18 1751 96 %     Weight 10/02/18 1752 157 lb (71.2 kg)     Height 10/02/18 1752 5\' 7"  (1.702 m)     Head Circumference --      Peak Flow --      Pain Score 10/02/18 1752 0   Constitutional: Alert and oriented.  Eyes: Conjunctivae are normal.  ENT      Head: Normocephalic and atraumatic.      Nose: No congestion/rhinnorhea.      Mouth/Throat: Mucous membranes are moist.      Neck: No stridor. Hematological/Lymphatic/Immunilogical: No cervical  lymphadenopathy. Cardiovascular: Normal rate, regular rhythm.  No murmurs, rubs, or gallops.  Respiratory: Normal respiratory effort without tachypnea nor retractions. Breath sounds are clear and equal bilaterally. No wheezes/rales/rhonchi. Gastrointestinal: Soft and non tender. No rebound. No guarding.  Genitourinary: Deferred Musculoskeletal: Normal range of motion in all extremities. Hands held slightly in contracture (wife states is patient's baseline from CVA) Neurologic:  Normal speech and language. No gross focal neurologic deficits are appreciated.  Skin:  Skin is warm, dry and intact. No rash noted. Psychiatric: Mood and affect are normal. Speech and behavior are normal. Patient exhibits appropriate insight and judgment.  ____________________________________________    LABS (pertinent positives/negatives)  Trop <0.03 CBC wnl BMP wnl except glu 101, cr 1.50  ____________________________________________    RADIOLOGY  None  ____________________________________________   PROCEDURES  Procedures  ____________________________________________   INITIAL IMPRESSION / ASSESSMENT AND PLAN / ED COURSE  Pertinent labs & imaging results that were available during my care of the patient were reviewed by me and considered in my medical decision making (see chart for details).   Patient presented to the emergency department today because of concerns for high blood pressure.  Patient denies any chest pain or headache.  On exam he appears well.  The patient's blood work shows chronic kidney dysfunction.  No elevated troponin.  At this point unclear etiology of the high blood pressure but it does not appear he is suffering any endorgan damage.  Discussed with patient and patient family importance of continued primary care follow-up.  ____________________________________________   FINAL CLINICAL IMPRESSION(S) / ED DIAGNOSES  Final diagnoses:  Hypertension, unspecified type      Note: This dictation was prepared with Dragon dictation. Any transcriptional errors that result from this process are unintentional     Phineas Semen, MD 10/02/18 2315

## 2018-10-02 NOTE — ED Notes (Signed)
Esign not working pt verbalized discharge instructions and has no questions at this time 

## 2018-10-02 NOTE — ED Triage Notes (Signed)
Patient presents to the ED after having hypertension at his doctor today for his annual check up.  MD told patient to go home and take an extra blood pressure pill and re-check his pressure.  Pressure remained 218/59 per patient's wife.  Patient is in no obvious distress at this time.  Denies dizziness, blurry vision, and headache.

## 2018-10-02 NOTE — Discharge Instructions (Addendum)
Please seek medical attention for any high fevers, chest pain, shortness of breath, change in behavior, persistent vomiting, bloody stool or any other new or concerning symptoms.  

## 2018-10-02 NOTE — ED Notes (Addendum)
Pt came out of room stating he is leaving he has got to go. Family stating they have been waiting long enough. MD notified.

## 2018-10-02 NOTE — ED Notes (Signed)
Pt informed of needing urine

## 2021-07-15 ENCOUNTER — Emergency Department
Admission: EM | Admit: 2021-07-15 | Discharge: 2021-07-15 | Disposition: A | Discharge status: Home / Self Care | Payer: Self-pay | Attending: Emergency Medicine | Admitting: Emergency Medicine

## 2021-07-15 ENCOUNTER — Other Ambulatory Visit: Payer: Self-pay

## 2021-07-15 ENCOUNTER — Encounter: Payer: Self-pay | Admitting: Emergency Medicine

## 2021-07-15 DIAGNOSIS — Z79899 Other long term (current) drug therapy: Secondary | ICD-10-CM | POA: Insufficient documentation

## 2021-07-15 DIAGNOSIS — I2510 Atherosclerotic heart disease of native coronary artery without angina pectoris: Secondary | ICD-10-CM | POA: Insufficient documentation

## 2021-07-15 DIAGNOSIS — T679XXA Effect of heat and light, unspecified, initial encounter: Secondary | ICD-10-CM

## 2021-07-15 DIAGNOSIS — X30XXXA Exposure to excessive natural heat, initial encounter: Secondary | ICD-10-CM | POA: Insufficient documentation

## 2021-07-15 DIAGNOSIS — I130 Hypertensive heart and chronic kidney disease with heart failure and stage 1 through stage 4 chronic kidney disease, or unspecified chronic kidney disease: Secondary | ICD-10-CM | POA: Insufficient documentation

## 2021-07-15 DIAGNOSIS — I509 Heart failure, unspecified: Secondary | ICD-10-CM | POA: Insufficient documentation

## 2021-07-15 DIAGNOSIS — Z7902 Long term (current) use of antithrombotics/antiplatelets: Secondary | ICD-10-CM | POA: Insufficient documentation

## 2021-07-15 DIAGNOSIS — R55 Syncope and collapse: Secondary | ICD-10-CM

## 2021-07-15 DIAGNOSIS — F17200 Nicotine dependence, unspecified, uncomplicated: Secondary | ICD-10-CM | POA: Insufficient documentation

## 2021-07-15 DIAGNOSIS — N183 Chronic kidney disease, stage 3 unspecified: Secondary | ICD-10-CM | POA: Insufficient documentation

## 2021-07-15 DIAGNOSIS — E860 Dehydration: Secondary | ICD-10-CM | POA: Insufficient documentation

## 2021-07-15 LAB — CBC
HCT: 36.3 % — ABNORMAL LOW (ref 39.0–52.0)
Hemoglobin: 12.2 g/dL — ABNORMAL LOW (ref 13.0–17.0)
MCH: 33 pg (ref 26.0–34.0)
MCHC: 33.6 g/dL (ref 30.0–36.0)
MCV: 98.1 fL (ref 80.0–100.0)
Platelets: 254 10*3/uL (ref 150–400)
RBC: 3.7 MIL/uL — ABNORMAL LOW (ref 4.22–5.81)
RDW: 13.9 % (ref 11.5–15.5)
WBC: 11.9 10*3/uL — ABNORMAL HIGH (ref 4.0–10.5)
nRBC: 0 % (ref 0.0–0.2)

## 2021-07-15 LAB — BASIC METABOLIC PANEL
Anion gap: 7 (ref 5–15)
BUN: 19 mg/dL (ref 8–23)
CO2: 20 mmol/L — ABNORMAL LOW (ref 22–32)
Calcium: 8.3 mg/dL — ABNORMAL LOW (ref 8.9–10.3)
Chloride: 111 mmol/L (ref 98–111)
Creatinine, Ser: 1.69 mg/dL — ABNORMAL HIGH (ref 0.61–1.24)
GFR, Estimated: 39 mL/min — ABNORMAL LOW (ref >60)
Glucose, Bld: 116 mg/dL — ABNORMAL HIGH (ref 70–99)
Potassium: 4.1 mmol/L (ref 3.5–5.1)
Sodium: 138 mmol/L (ref 135–145)

## 2021-07-15 MED ORDER — LACTATED RINGERS IV BOLUS
1000.0000 mL | Freq: Once | INTRAVENOUS | Status: AC
  Administered 2021-07-15: 1000 mL via INTRAVENOUS

## 2021-07-15 NOTE — ED Notes (Signed)
Pt given [place] sandwich, saltine crackers, water, and a bag of chips.

## 2021-07-15 NOTE — ED Notes (Signed)
Pt assisted to the bathroom and back into bed. Pt ambulated independently with slight steadying assist getting back into bed.

## 2021-07-15 NOTE — ED Provider Notes (Signed)
[facility] Emergency Department Provider Note ____________________________________________   Event Date/Time   First MD Initiated Contact with Patient 07/15/21 1659     (approximate)  I have reviewed the triage vital signs and the nursing notes.  HISTORY  Chief Complaint Near Syncope   HPI Gamaliel Charney. is a 87 y.o. malewho presents to the ED for evaluation of weakness and near syncope.  Chart review indicates history of CAD, HTN, HLD, CVA, CKD.  PAD.  Continued cigarette smoking.  Patient presents to the ED for evaluation of generalized weakness and an episode of near syncope today while working outside.  He reports he was outside (today is a hot and sunny day) working on his lawnmower when he began feeling quite weak.  Patient reports he came inside, but fell down to his knees due to bilateral leg weakness.  He reports having difficulty getting up, so his wife called 911 for evaluation.  He was noted to be hypotensive with EMS, so they started IV fluids.  Patient denies any associated syncopal episodes, chest pain, shortness of breath or headache.  He reports, after receiving 500 cc of normal saline, feeling much better and is requesting discharge.  Past Medical History:  Diagnosis Date   CHF (congestive heart failure) (HCC)    Per patient questionaire   CKD (chronic kidney disease) stage 3, GFR 30-59 ml/min (HCC)    CVA (cerebral infarction)    Heart attack (HCC)    Hyperlipidemia    Hypertension     Patient Active Problem List   Diagnosis Date Noted   HLD (hyperlipidemia) 08/16/2017   HTN (hypertension) 08/16/2017   CKD (chronic kidney disease), stage III (HCC) 08/16/2017   Chest pain 10/25/2015    Past Surgical History:  Procedure Laterality Date   CAROTID ENDARTERECTOMY     CORONARY ANGIOPLASTY WITH STENT PLACEMENT      Prior to Admission medications   Medication Sig Start Date End Date Taking? Authorizing Provider   clopidogrel (PLAVIX) 75 MG tablet Take 75 mg by mouth daily. 08/20/15   [provider]  folic acid (FOLVITE) 1 MG tablet Take 1 mg by mouth daily. 10/20/15   [provider]  losartan (COZAAR) 50 MG tablet Take 50 mg by mouth daily.    [provider]  lovastatin (MEVACOR) 40 MG tablet Take 40 mg by mouth at bedtime. 09/09/15   [provider]  metoprolol (LOPRESSOR) 50 MG tablet Take 50 mg by mouth 2 (two) times daily. 09/09/15   [provider]  tamsulosin (FLOMAX) 0.4 MG CAPS capsule Take 0.4 mg by mouth daily.    [provider]    Allergies Oxycodone-acetaminophen and Simvastatin  Family History  Problem Relation Age of Onset   Heart disease Mother    Pleurisy Father    Heart disease Sister     Social History Social History   Tobacco Use   Smoking status: Every Day   Smokeless tobacco: Never  Substance Use Topics   Alcohol use: No   Drug use: No    Review of Systems  Constitutional: No fever/chills.  Positive for generalized weakness. Eyes: No visual changes. ENT: No sore throat. Cardiovascular: Denies chest pain. Respiratory: Denies shortness of breath. Gastrointestinal: No abdominal pain.  No nausea, no vomiting.  No diarrhea.  No constipation. Genitourinary: Negative for dysuria. Musculoskeletal: Negative for back pain. Skin: Negative for rash. Neurological: Negative for headaches, focal weakness or numbness.  ____________________________________________   PHYSICAL EXAM:  VITAL SIGNS: Vitals:   07/15/21 1800 07/15/21 1809  BP: (!) 73/60 (!) 85/61  Pulse: (!) 57 75  Resp: 15   Temp:    SpO2: 100% 100%     Constitutional: Alert and oriented. Well appearing and in no acute distress. Eyes: Conjunctivae are normal. PERRL. EOMI. Head: Atraumatic. Nose: No congestion/rhinnorhea. Mouth/Throat: Mucous membranes are moist.  Oropharynx non-erythematous. Neck: No stridor. No cervical spine tenderness to  palpation. Cardiovascular: Normal rate, regular rhythm. Grossly normal heart sounds.  Good peripheral circulation. Respiratory: Normal respiratory effort.  No retractions. Diffuse and scattered expiratory wheezes without focal features. Gastrointestinal: Soft , nondistended, nontender to palpation. No CVA tenderness. Musculoskeletal: No lower extremity tenderness nor edema.  No joint effusions. No signs of acute trauma. Neurologic:  Normal speech and language. No gross focal neurologic deficits are appreciated. Cranial nerves II through XII intact 5/5 strength and sensation in all 4 extremities Skin:  Skin is warm, dry and intact. No rash noted. Psychiatric: Mood and affect are normal. Speech and behavior are normal.  ____________________________________________   LABS (all labs ordered are listed, but only abnormal results are displayed)  Labs Reviewed  BASIC METABOLIC PANEL - Abnormal; Notable for the following components:      Result Value   CO2 20 (*)    Glucose, Bld 116 (*)    Creatinine, Ser 1.69 (*)    Calcium 8.3 (*)    GFR, Estimated 39 (*)    All other components within normal limits  CBC - Abnormal; Notable for the following components:   WBC 11.9 (*)    RBC 3.70 (*)    Hemoglobin 12.2 (*)    HCT 36.3 (*)    All other components within normal limits  URINALYSIS, COMPLETE (UACMP) WITH MICROSCOPIC   ____________________________________________  12 Lead EKG  Sinus rhythm, rate of 77 bpm.  Leftward axis.  Right bundle.  No evidence of acute ischemia. ____________________________________________  RADIOLOGY  ED MD interpretation:    Official radiology report(s): No results found.  ____________________________________________   PROCEDURES and INTERVENTIONS  Procedure(s) performed (including Critical Care):  .1-3 Lead EKG Interpretation  Date/Time: 07/15/2021 5:52 PM Performed by: Delton Prairie, MD Authorized by: Delton Prairie, MD     Interpretation:  normal     ECG rate:  72   ECG rate assessment: normal     Rhythm: sinus rhythm     Ectopy: none     Conduction: normal    Medications  lactated ringers bolus 1,000 mL (1,000 mLs Intravenous New Bag/Given 07/15/21 1747)    ____________________________________________   MDM / ED COURSE   87 year old male presents to the ED after an episode of weakness and near syncope after being out in the sun on a hot day, with evidence of dehydration and mild renal dysfunction, improving with IV fluids and ultimately amenable to outpatient management.  He presents with soft blood pressures, improving with IV fluids.  His clinical picture is generally reassuring without evidence of neurologic deficits, trauma or acute derangements.  Blood work shows stigmata of dehydration with slight decrease in his bicarbonates and slight worsening of his renal function from his known CKD.  EKG is nonischemic with no concerning interval changes.  He tolerates p.o. intake, and ambulates with nursing to the restroom.  He demands discharge prior to completion of his IV fluids.  He has capacity make this decision and I see no barriers to outpatient management.  We will discharge with return precautions.  Clinical Course as of  07/15/21 1832  Wed Jul 15, 2021  1820 Patient sitting up in bed, tolerating p.o. intake and looks well. [DS]  1828 Wife repeatedly requesting discharge for the patient. [DS]  1830 Reassessed.  Patient reports feeling better and is again requesting discharge.  He has tolerated half of his [place] sandwich and meal tray.  Denies any abdominal pain, nausea or emesis.  We discussed following up with his PCP for a recheck of his metabolic panel/renal function.  We discussed return precautions for the ED. [DS]    Clinical Course User Index [DS] Delton Prairie, MD    ____________________________________________   FINAL CLINICAL IMPRESSION(S) / ED DIAGNOSES  Final diagnoses:  Dehydration  Near syncope   Heat exposure, initial encounter     ED Discharge Orders     None        Thinh Cuccaro Katrinka Blazing   Note:  This document was prepared using Dragon voice recognition software and may include unintentional dictation errors.    Delton Prairie, MD 07/15/21 [phone]

## 2021-07-15 NOTE — ED Triage Notes (Signed)
Pt comes into the ED via [facility] from home c/o near syncopal episode after being outside working on his car.  Pt had initial BP of 80/50's but after given IVF he was up to 118/55.  Pt never fell, had LOC or hit his head.  Pt had CBG of 123, 80 HR at NSR, and 99temp. Pt was diaphoretic upon initial =EMS response, but is no longer diaphoretic. Pt in NAD with even and unlabored respirations.

## 2022-01-06 DEATH — deceased
